# Patient Record
Sex: Male | Born: 1985 | Race: White | Hispanic: No | Marital: Single | Smoking: Former smoker
Health system: Southern US, Community
[De-identification: ages and names within clinical notes are randomized; demographics above are authoritative.]

## PROBLEM LIST (undated history)

## (undated) DIAGNOSIS — S329XXA Fracture of unspecified parts of lumbosacral spine and pelvis, initial encounter for closed fracture: Secondary | ICD-10-CM

## (undated) DIAGNOSIS — S42309A Unspecified fracture of shaft of humerus, unspecified arm, initial encounter for closed fracture: Secondary | ICD-10-CM

## (undated) HISTORY — PX: HAND SURGERY: SHX662

---

## 2006-09-20 ENCOUNTER — Emergency Department (HOSPITAL_COMMUNITY): Admission: EM | Admit: 2006-09-20 | Discharge: 2006-09-20 | Payer: Self-pay | Admitting: Emergency Medicine

## 2007-08-20 ENCOUNTER — Encounter: Admission: RE | Admit: 2007-08-20 | Discharge: 2007-08-20 | Payer: Self-pay | Admitting: Family Medicine

## 2007-09-01 ENCOUNTER — Ambulatory Visit: Payer: Self-pay | Admitting: Psychiatry

## 2007-09-01 ENCOUNTER — Other Ambulatory Visit (HOSPITAL_COMMUNITY): Admission: RE | Admit: 2007-09-01 | Discharge: 2007-09-12 | Payer: Self-pay | Admitting: Psychiatry

## 2009-05-20 ENCOUNTER — Ambulatory Visit (HOSPITAL_BASED_OUTPATIENT_CLINIC_OR_DEPARTMENT_OTHER): Admission: RE | Admit: 2009-05-20 | Discharge: 2009-05-20 | Payer: Self-pay | Admitting: Orthopedic Surgery

## 2010-06-16 LAB — POCT HEMOGLOBIN-HEMACUE: Hemoglobin: 15.7 g/dL (ref 13.0–17.0)

## 2015-03-01 DIAGNOSIS — S42309A Unspecified fracture of shaft of humerus, unspecified arm, initial encounter for closed fracture: Secondary | ICD-10-CM

## 2015-03-01 DIAGNOSIS — S329XXA Fracture of unspecified parts of lumbosacral spine and pelvis, initial encounter for closed fracture: Secondary | ICD-10-CM

## 2015-03-01 HISTORY — DX: Fracture of unspecified parts of lumbosacral spine and pelvis, initial encounter for closed fracture: S32.9XXA

## 2015-03-01 HISTORY — DX: Unspecified fracture of shaft of humerus, unspecified arm, initial encounter for closed fracture: S42.309A

## 2015-03-02 ENCOUNTER — Inpatient Hospital Stay (HOSPITAL_COMMUNITY): Payer: No Typology Code available for payment source | Admitting: Certified Registered"

## 2015-03-02 ENCOUNTER — Emergency Department (HOSPITAL_COMMUNITY): Payer: No Typology Code available for payment source

## 2015-03-02 ENCOUNTER — Encounter (HOSPITAL_COMMUNITY): Payer: Self-pay | Admitting: Emergency Medicine

## 2015-03-02 ENCOUNTER — Encounter (HOSPITAL_COMMUNITY): Admission: EM | Disposition: A | Discharge status: Home Health | Payer: Self-pay | Source: Home / Self Care

## 2015-03-02 ENCOUNTER — Inpatient Hospital Stay (HOSPITAL_COMMUNITY): Payer: No Typology Code available for payment source

## 2015-03-02 ENCOUNTER — Inpatient Hospital Stay (HOSPITAL_COMMUNITY)
Admission: EM | Admit: 2015-03-02 | Discharge: 2015-03-10 | DRG: 958 | Disposition: A | Discharge status: Home Health | Payer: No Typology Code available for payment source | Attending: Surgery | Admitting: Surgery

## 2015-03-02 DIAGNOSIS — S32810A Multiple fractures of pelvis with stable disruption of pelvic ring, initial encounter for closed fracture: Secondary | ICD-10-CM | POA: Diagnosis present

## 2015-03-02 DIAGNOSIS — E875 Hyperkalemia: Secondary | ICD-10-CM | POA: Diagnosis present

## 2015-03-02 DIAGNOSIS — N179 Acute kidney failure, unspecified: Secondary | ICD-10-CM | POA: Diagnosis present

## 2015-03-02 DIAGNOSIS — S32059A Unspecified fracture of fifth lumbar vertebra, initial encounter for closed fracture: Secondary | ICD-10-CM | POA: Diagnosis present

## 2015-03-02 DIAGNOSIS — Y906 Blood alcohol level of 120-199 mg/100 ml: Secondary | ICD-10-CM | POA: Diagnosis present

## 2015-03-02 DIAGNOSIS — S42302B Unspecified fracture of shaft of humerus, left arm, initial encounter for open fracture: Secondary | ICD-10-CM | POA: Diagnosis present

## 2015-03-02 DIAGNOSIS — Z419 Encounter for procedure for purposes other than remedying health state, unspecified: Secondary | ICD-10-CM

## 2015-03-02 DIAGNOSIS — R338 Other retention of urine: Secondary | ICD-10-CM | POA: Diagnosis not present

## 2015-03-02 DIAGNOSIS — R739 Hyperglycemia, unspecified: Secondary | ICD-10-CM | POA: Diagnosis present

## 2015-03-02 DIAGNOSIS — R339 Retention of urine, unspecified: Secondary | ICD-10-CM | POA: Diagnosis not present

## 2015-03-02 DIAGNOSIS — F10129 Alcohol abuse with intoxication, unspecified: Secondary | ICD-10-CM | POA: Diagnosis present

## 2015-03-02 DIAGNOSIS — T148 Other injury of unspecified body region: Secondary | ICD-10-CM | POA: Diagnosis present

## 2015-03-02 DIAGNOSIS — S42302A Unspecified fracture of shaft of humerus, left arm, initial encounter for closed fracture: Secondary | ICD-10-CM | POA: Diagnosis present

## 2015-03-02 DIAGNOSIS — S32009A Unspecified fracture of unspecified lumbar vertebra, initial encounter for closed fracture: Secondary | ICD-10-CM | POA: Diagnosis present

## 2015-03-02 DIAGNOSIS — T148XXA Other injury of unspecified body region, initial encounter: Secondary | ICD-10-CM

## 2015-03-02 DIAGNOSIS — S329XXA Fracture of unspecified parts of lumbosacral spine and pelvis, initial encounter for closed fracture: Secondary | ICD-10-CM

## 2015-03-02 DIAGNOSIS — S32302A Unspecified fracture of left ilium, initial encounter for closed fracture: Secondary | ICD-10-CM | POA: Diagnosis present

## 2015-03-02 HISTORY — PX: I&D EXTREMITY: SHX5045

## 2015-03-02 HISTORY — DX: Fracture of unspecified parts of lumbosacral spine and pelvis, initial encounter for closed fracture: S32.9XXA

## 2015-03-02 HISTORY — PX: ORIF HUMERUS FRACTURE: SHX2126

## 2015-03-02 HISTORY — DX: Unspecified fracture of shaft of humerus, unspecified arm, initial encounter for closed fracture: S42.309A

## 2015-03-02 LAB — CBC
HEMATOCRIT: 45.2 % (ref 39.0–52.0)
HEMOGLOBIN: 15.4 g/dL (ref 13.0–17.0)
MCH: 32.6 pg (ref 26.0–34.0)
MCHC: 34.1 g/dL (ref 30.0–36.0)
MCV: 95.8 fL (ref 78.0–100.0)
Platelets: 317 10*3/uL (ref 150–400)
RBC: 4.72 MIL/uL (ref 4.22–5.81)
RDW: 12.4 % (ref 11.5–15.5)
WBC: 15.4 10*3/uL — AB (ref 4.0–10.5)

## 2015-03-02 LAB — ETHANOL: Alcohol, Ethyl (B): 190 mg/dL — ABNORMAL HIGH (ref <5)

## 2015-03-02 LAB — ABO/RH: ABO/RH(D): O POS

## 2015-03-02 LAB — COMPREHENSIVE METABOLIC PANEL
ALBUMIN: 3.6 g/dL (ref 3.5–5.0)
ALT: 66 U/L — ABNORMAL HIGH (ref 17–63)
ANION GAP: 12 (ref 5–15)
AST: 51 U/L — ABNORMAL HIGH (ref 15–41)
Alkaline Phosphatase: 81 U/L (ref 38–126)
BILIRUBIN TOTAL: 0.5 mg/dL (ref 0.3–1.2)
BUN: 12 mg/dL (ref 6–20)
CHLORIDE: 105 mmol/L (ref 101–111)
CO2: 21 mmol/L — ABNORMAL LOW (ref 22–32)
Calcium: 8.4 mg/dL — ABNORMAL LOW (ref 8.9–10.3)
Creatinine, Ser: 1.48 mg/dL — ABNORMAL HIGH (ref 0.61–1.24)
GLUCOSE: 141 mg/dL — AB (ref 65–99)
POTASSIUM: 3.6 mmol/L (ref 3.5–5.1)
Sodium: 138 mmol/L (ref 135–145)
TOTAL PROTEIN: 6.6 g/dL (ref 6.5–8.1)

## 2015-03-02 LAB — TYPE AND SCREEN
ABO/RH(D): O POS
ANTIBODY SCREEN: NEGATIVE
UNIT DIVISION: 0
Unit division: 0

## 2015-03-02 LAB — PREPARE FRESH FROZEN PLASMA
UNIT DIVISION: 0
Unit division: 0

## 2015-03-02 LAB — BLOOD PRODUCT ORDER (VERBAL) VERIFICATION

## 2015-03-02 LAB — PROTIME-INR
INR: 1.02 (ref 0.00–1.49)
PROTHROMBIN TIME: 13.6 s (ref 11.6–15.2)

## 2015-03-02 LAB — SURGICAL PCR SCREEN
MRSA, PCR: NEGATIVE
Staphylococcus aureus: POSITIVE — AB

## 2015-03-02 LAB — CDS SEROLOGY

## 2015-03-02 SURGERY — IRRIGATION AND DEBRIDEMENT EXTREMITY
Anesthesia: Regional | Site: Arm Upper | Laterality: Left

## 2015-03-02 MED ORDER — METOCLOPRAMIDE HCL 5 MG/ML IJ SOLN
INTRAMUSCULAR | Status: AC
  Filled 2015-03-02: qty 2

## 2015-03-02 MED ORDER — TETANUS-DIPHTH-ACELL PERTUSSIS 5-2.5-18.5 LF-MCG/0.5 IM SUSP
INTRAMUSCULAR | Status: AC
  Filled 2015-03-02: qty 0.5

## 2015-03-02 MED ORDER — MEPERIDINE HCL 25 MG/ML IJ SOLN: 6.2500 mg | INTRAMUSCULAR | Status: DC | PRN

## 2015-03-02 MED ORDER — FENTANYL CITRATE (PF) 250 MCG/5ML IJ SOLN
INTRAMUSCULAR | Status: AC
  Filled 2015-03-02: qty 5

## 2015-03-02 MED ORDER — PROPOFOL 10 MG/ML IV BOLUS
INTRAVENOUS | Status: AC
  Filled 2015-03-02: qty 20

## 2015-03-02 MED ORDER — ROCURONIUM BROMIDE 50 MG/5ML IV SOLN
INTRAVENOUS | Status: AC
  Filled 2015-03-02: qty 1

## 2015-03-02 MED ORDER — ENOXAPARIN SODIUM 40 MG/0.4ML ~~LOC~~ SOLN
40.0000 mg | SUBCUTANEOUS | Status: DC
Start: 2015-03-03 — End: 2015-03-07
  Administered 2015-03-03 – 2015-03-06 (×4): 40 mg via SUBCUTANEOUS
  Filled 2015-03-02 (×4): qty 0.4

## 2015-03-02 MED ORDER — HYDROMORPHONE HCL 1 MG/ML IJ SOLN: 0.2500 mg | INTRAMUSCULAR | Status: DC | PRN

## 2015-03-02 MED ORDER — ONDANSETRON HCL 4 MG/2ML IJ SOLN
INTRAMUSCULAR | Status: DC | PRN
  Administered 2015-03-02: 4 mg via INTRAVENOUS

## 2015-03-02 MED ORDER — MORPHINE SULFATE (PF) 4 MG/ML IV SOLN
4.0000 mg | Freq: Once | INTRAVENOUS | Status: DC
Start: 2015-03-02 — End: 2015-03-02

## 2015-03-02 MED ORDER — SODIUM CHLORIDE 0.9 % IV SOLN
INTRAVENOUS | Status: AC | PRN
  Administered 2015-03-02: 10 mL/h via INTRAVENOUS

## 2015-03-02 MED ORDER — MORPHINE SULFATE (PF) 4 MG/ML IV SOLN: 4.0000 mg | INTRAVENOUS | Status: DC | PRN

## 2015-03-02 MED ORDER — MUPIROCIN 2 % EX OINT
TOPICAL_OINTMENT | Freq: Two times a day (BID) | CUTANEOUS | Status: DC
  Administered 2015-03-02 – 2015-03-03 (×3): via NASAL
  Administered 2015-03-04: 1 via NASAL
  Administered 2015-03-04: 11:00:00 via NASAL
  Administered 2015-03-05: 1 via NASAL
  Administered 2015-03-05: 10:00:00 via NASAL
  Administered 2015-03-06: 1 via NASAL
  Administered 2015-03-06 – 2015-03-10 (×8): via NASAL
  Filled 2015-03-02 (×3): qty 22

## 2015-03-02 MED ORDER — HYDROMORPHONE HCL 1 MG/ML IJ SOLN
1.0000 mg | INTRAMUSCULAR | Status: DC | PRN
  Administered 2015-03-02: 1 mg via INTRAVENOUS
  Filled 2015-03-02: qty 1

## 2015-03-02 MED ORDER — SUCCINYLCHOLINE CHLORIDE 20 MG/ML IJ SOLN
INTRAMUSCULAR | Status: DC | PRN
  Administered 2015-03-02: 120 mg via INTRAVENOUS

## 2015-03-02 MED ORDER — ONDANSETRON HCL 4 MG/2ML IJ SOLN
INTRAMUSCULAR | Status: AC
  Filled 2015-03-02: qty 2

## 2015-03-02 MED ORDER — LACTATED RINGERS IV SOLN
INTRAVENOUS | Status: DC
  Administered 2015-03-02 (×3): via INTRAVENOUS

## 2015-03-02 MED ORDER — METOCLOPRAMIDE HCL 5 MG/ML IJ SOLN
INTRAMUSCULAR | Status: DC | PRN
  Administered 2015-03-02: 10 mg via INTRAVENOUS

## 2015-03-02 MED ORDER — OXYCODONE HCL 5 MG PO TABS: 5.0000 mg | ORAL_TABLET | Freq: Once | ORAL | Status: DC | PRN

## 2015-03-02 MED ORDER — ONDANSETRON HCL 4 MG PO TABS: 4.0000 mg | ORAL_TABLET | Freq: Four times a day (QID) | ORAL | Status: DC | PRN

## 2015-03-02 MED ORDER — MORPHINE SULFATE (PF) 2 MG/ML IV SOLN
2.0000 mg | INTRAVENOUS | Status: DC | PRN
  Administered 2015-03-02 (×2): 2 mg via INTRAVENOUS
  Filled 2015-03-02 (×3): qty 1

## 2015-03-02 MED ORDER — PROMETHAZINE HCL 25 MG/ML IJ SOLN
6.2500 mg | INTRAMUSCULAR | Status: DC | PRN
Start: 2015-03-02 — End: 2015-03-02

## 2015-03-02 MED ORDER — MORPHINE SULFATE (PF) 2 MG/ML IV SOLN
INTRAVENOUS | Status: AC
  Administered 2015-03-02: 4 mg via INTRAVENOUS
  Filled 2015-03-02: qty 2

## 2015-03-02 MED ORDER — CEFAZOLIN SODIUM 1-5 GM-% IV SOLN
1.0000 g | Freq: Three times a day (TID) | INTRAVENOUS | Status: AC
  Administered 2015-03-03 (×3): 1 g via INTRAVENOUS
  Filled 2015-03-02 (×3): qty 50

## 2015-03-02 MED ORDER — POTASSIUM CHLORIDE IN NACL 20-0.9 MEQ/L-% IV SOLN
INTRAVENOUS | Status: DC
  Administered 2015-03-02: 12:00:00 via INTRAVENOUS
  Filled 2015-03-02: qty 1000

## 2015-03-02 MED ORDER — FENTANYL CITRATE (PF) 100 MCG/2ML IJ SOLN
INTRAMUSCULAR | Status: DC | PRN
  Administered 2015-03-02: 50 ug via INTRAVENOUS
  Administered 2015-03-02: 100 ug via INTRAVENOUS
  Administered 2015-03-02: 150 ug via INTRAVENOUS
  Administered 2015-03-02 (×2): 100 ug via INTRAVENOUS

## 2015-03-02 MED ORDER — LIDOCAINE HCL (CARDIAC) 20 MG/ML IV SOLN
INTRAVENOUS | Status: DC | PRN
  Administered 2015-03-02: 20 mg via INTRAVENOUS

## 2015-03-02 MED ORDER — PROMETHAZINE HCL 25 MG/ML IJ SOLN: 6.2500 mg | INTRAMUSCULAR | Status: DC | PRN

## 2015-03-02 MED ORDER — FENTANYL CITRATE (PF) 100 MCG/2ML IJ SOLN
INTRAMUSCULAR | Status: AC
  Administered 2015-03-02: 100 ug
  Filled 2015-03-02: qty 2

## 2015-03-02 MED ORDER — TETANUS-DIPHTH-ACELL PERTUSSIS 5-2.5-18.5 LF-MCG/0.5 IM SUSP
0.5000 mL | Freq: Once | INTRAMUSCULAR | Status: AC
  Administered 2015-03-02: 0.5 mL via INTRAMUSCULAR

## 2015-03-02 MED ORDER — BUPIVACAINE-EPINEPHRINE (PF) 0.5% -1:200000 IJ SOLN
INTRAMUSCULAR | Status: DC | PRN
  Administered 2015-03-02: 30 mL via PERINEURAL

## 2015-03-02 MED ORDER — HYDROMORPHONE HCL 1 MG/ML IJ SOLN
INTRAMUSCULAR | Status: AC
  Filled 2015-03-02: qty 1

## 2015-03-02 MED ORDER — DEXAMETHASONE SODIUM PHOSPHATE 10 MG/ML IJ SOLN
INTRAMUSCULAR | Status: AC
  Filled 2015-03-02: qty 1

## 2015-03-02 MED ORDER — DIPHENHYDRAMINE HCL 50 MG/ML IJ SOLN
INTRAMUSCULAR | Status: DC | PRN
  Administered 2015-03-02: 25 mg via INTRAVENOUS

## 2015-03-02 MED ORDER — MIDAZOLAM HCL 2 MG/2ML IJ SOLN
INTRAMUSCULAR | Status: AC
  Filled 2015-03-02: qty 2

## 2015-03-02 MED ORDER — MORPHINE SULFATE (PF) 2 MG/ML IV SOLN: 1.0000 mg | INTRAVENOUS | Status: DC | PRN

## 2015-03-02 MED ORDER — MIDAZOLAM HCL 5 MG/5ML IJ SOLN
INTRAMUSCULAR | Status: DC | PRN
  Administered 2015-03-02: 2 mg via INTRAVENOUS

## 2015-03-02 MED ORDER — CEFAZOLIN SODIUM-DEXTROSE 2-3 GM-% IV SOLR
INTRAVENOUS | Status: DC | PRN
  Administered 2015-03-02: 2 g via INTRAVENOUS

## 2015-03-02 MED ORDER — CEFAZOLIN SODIUM-DEXTROSE 2-3 GM-% IV SOLR
INTRAVENOUS | Status: AC
  Administered 2015-03-02: 2000 mg via INTRAVENOUS
  Filled 2015-03-02: qty 50

## 2015-03-02 MED ORDER — OXYCODONE HCL 5 MG/5ML PO SOLN: 5.0000 mg | Freq: Once | ORAL | Status: DC | PRN

## 2015-03-02 MED ORDER — DIPHENHYDRAMINE HCL 50 MG/ML IJ SOLN
INTRAMUSCULAR | Status: AC
  Filled 2015-03-02: qty 1

## 2015-03-02 MED ORDER — BETHANECHOL CHLORIDE 25 MG PO TABS
25.0000 mg | ORAL_TABLET | Freq: Four times a day (QID) | ORAL | Status: DC
  Administered 2015-03-02 – 2015-03-10 (×30): 25 mg via ORAL
  Filled 2015-03-02 (×30): qty 1

## 2015-03-02 MED ORDER — CEFAZOLIN SODIUM-DEXTROSE 2-3 GM-% IV SOLR
2.0000 g | Freq: Once | INTRAVENOUS | Status: AC
  Administered 2015-03-02: 2000 mg via INTRAVENOUS

## 2015-03-02 MED ORDER — PHENYLEPHRINE 40 MCG/ML (10ML) SYRINGE FOR IV PUSH (FOR BLOOD PRESSURE SUPPORT)
PREFILLED_SYRINGE | INTRAVENOUS | Status: AC
  Filled 2015-03-02: qty 10

## 2015-03-02 MED ORDER — MUPIROCIN 2 % EX OINT: TOPICAL_OINTMENT | Freq: Two times a day (BID) | CUTANEOUS | Status: DC

## 2015-03-02 MED ORDER — PNEUMOCOCCAL VAC POLYVALENT 25 MCG/0.5ML IJ INJ
0.5000 mL | INJECTION | INTRAMUSCULAR | Status: AC
  Administered 2015-03-03: 0.5 mL via INTRAMUSCULAR
  Filled 2015-03-02: qty 0.5

## 2015-03-02 MED ORDER — CEFAZOLIN SODIUM-DEXTROSE 2-3 GM-% IV SOLR
2.0000 g | INTRAVENOUS | Status: DC
  Filled 2015-03-02 (×2): qty 50

## 2015-03-02 MED ORDER — MUPIROCIN 2 % EX OINT
TOPICAL_OINTMENT | CUTANEOUS | Status: AC
  Filled 2015-03-02: qty 22

## 2015-03-02 MED ORDER — PROPOFOL 10 MG/ML IV BOLUS
INTRAVENOUS | Status: DC | PRN
  Administered 2015-03-02: 200 mg via INTRAVENOUS

## 2015-03-02 MED ORDER — ONDANSETRON HCL 4 MG/2ML IJ SOLN: 4.0000 mg | Freq: Four times a day (QID) | INTRAMUSCULAR | Status: DC | PRN

## 2015-03-02 MED ORDER — DEXAMETHASONE SODIUM PHOSPHATE 10 MG/ML IJ SOLN
INTRAMUSCULAR | Status: DC | PRN
  Administered 2015-03-02: 10 mg via INTRAVENOUS

## 2015-03-02 MED ORDER — MORPHINE SULFATE (PF) 4 MG/ML IV SOLN
4.0000 mg | Freq: Once | INTRAVENOUS | Status: AC
  Administered 2015-03-02: 4 mg via INTRAVENOUS
  Filled 2015-03-02: qty 1

## 2015-03-02 MED ORDER — SODIUM CHLORIDE 0.9 % IR SOLN
Status: DC | PRN
  Administered 2015-03-02: 1000 mL

## 2015-03-02 MED ORDER — IOHEXOL 300 MG/ML  SOLN
100.0000 mL | Freq: Once | INTRAMUSCULAR | Status: AC | PRN
  Administered 2015-03-02: 100 mL via INTRAVENOUS

## 2015-03-02 SURGICAL SUPPLY — 64 items
BAG DECANTER FOR FLEXI CONT (MISCELLANEOUS) IMPLANT
BANDAGE ELASTIC 4 VELCRO ST LF (GAUZE/BANDAGES/DRESSINGS) ×3 IMPLANT
BENZOIN TINCTURE PRP APPL 2/3 (GAUZE/BANDAGES/DRESSINGS) ×3 IMPLANT
BIT DRILL QC 3.3X195 (BIT) ×3 IMPLANT
BNDG COHESIVE 4X5 TAN STRL (GAUZE/BANDAGES/DRESSINGS) ×3 IMPLANT
BNDG GAUZE ELAST 4 BULKY (GAUZE/BANDAGES/DRESSINGS) ×3 IMPLANT
CLOSURE WOUND 1/2 X4 (GAUZE/BANDAGES/DRESSINGS) ×1
COVER SURGICAL LIGHT HANDLE (MISCELLANEOUS) ×3 IMPLANT
CUFF TOURNIQUET SINGLE 18IN (TOURNIQUET CUFF) IMPLANT
DRAPE C-ARM 42X72 X-RAY (DRAPES) ×3 IMPLANT
DRAPE IMP U-DRAPE 54X76 (DRAPES) ×3 IMPLANT
DRAPE INCISE IOBAN 66X45 STRL (DRAPES) IMPLANT
DRAPE SURG 17X23 STRL (DRAPES) ×3 IMPLANT
DRAPE U-SHAPE 47X51 STRL (DRAPES) ×3 IMPLANT
DRSG AQUACEL AG ADV 3.5X14 (GAUZE/BANDAGES/DRESSINGS) ×3 IMPLANT
DRSG EMULSION OIL 3X3 NADH (GAUZE/BANDAGES/DRESSINGS) ×3 IMPLANT
DRSG PAD ABDOMINAL 8X10 ST (GAUZE/BANDAGES/DRESSINGS) ×6 IMPLANT
ELECT REM PT RETURN 9FT ADLT (ELECTROSURGICAL) ×3
ELECTRODE REM PT RTRN 9FT ADLT (ELECTROSURGICAL) ×1 IMPLANT
GAUZE SPONGE 4X4 12PLY STRL (GAUZE/BANDAGES/DRESSINGS) ×3 IMPLANT
GAUZE XEROFORM 5X9 LF (GAUZE/BANDAGES/DRESSINGS) ×3 IMPLANT
GLOVE BIOGEL PI IND STRL 8 (GLOVE) ×1 IMPLANT
GLOVE BIOGEL PI INDICATOR 8 (GLOVE) ×2
GLOVE ORTHO TXT STRL SZ7.5 (GLOVE) ×3 IMPLANT
GOWN STRL REUS W/ TWL LRG LVL3 (GOWN DISPOSABLE) ×2 IMPLANT
GOWN STRL REUS W/ TWL XL LVL3 (GOWN DISPOSABLE) IMPLANT
GOWN STRL REUS W/TWL 2XL LVL3 (GOWN DISPOSABLE) ×3 IMPLANT
GOWN STRL REUS W/TWL LRG LVL3 (GOWN DISPOSABLE) ×4
GOWN STRL REUS W/TWL XL LVL3 (GOWN DISPOSABLE)
HANDPIECE INTERPULSE COAX TIP (DISPOSABLE) ×2
KIT BASIN OR (CUSTOM PROCEDURE TRAY) ×3 IMPLANT
KIT ROOM TURNOVER OR (KITS) ×3 IMPLANT
MANIFOLD NEPTUNE II (INSTRUMENTS) ×3 IMPLANT
NEEDLE HYPO 25GX1X1/2 BEV (NEEDLE) IMPLANT
NS IRRIG 1000ML POUR BTL (IV SOLUTION) ×3 IMPLANT
PACK ORTHO EXTREMITY (CUSTOM PROCEDURE TRAY) IMPLANT
PACK SHOULDER (CUSTOM PROCEDURE TRAY) ×3 IMPLANT
PACK UNIVERSAL I (CUSTOM PROCEDURE TRAY) IMPLANT
PAD ARMBOARD 7.5X6 YLW CONV (MISCELLANEOUS) ×6 IMPLANT
PLATE NCB STRT PA 146 10H (Plate) ×3 IMPLANT
SCREW NCB 4.0 22MM (Screw) ×3 IMPLANT
SCREW NCB 4.0 28MM (Screw) ×3 IMPLANT
SCREW NCB 4.0X24MM (Screw) ×12 IMPLANT
SCREW NCB 4.0X26MM (Screw) ×12 IMPLANT
SET HNDPC FAN SPRY TIP SCT (DISPOSABLE) ×1 IMPLANT
SLING ARM FOAM STRAP LRG (SOFTGOODS) ×3 IMPLANT
SPONGE LAP 18X18 X RAY DECT (DISPOSABLE) ×3 IMPLANT
SPONGE LAP 4X18 X RAY DECT (DISPOSABLE) IMPLANT
STAPLER VISISTAT 35W (STAPLE) ×3 IMPLANT
STOCKINETTE IMPERVIOUS 9X36 MD (GAUZE/BANDAGES/DRESSINGS) IMPLANT
STRIP CLOSURE SKIN 1/2X4 (GAUZE/BANDAGES/DRESSINGS) ×2 IMPLANT
SUCTION FRAZIER TIP 10 FR DISP (SUCTIONS) ×3 IMPLANT
SUT ETHILON 4 0 PS 2 18 (SUTURE) IMPLANT
SUT FIBERWIRE #2 38 T-5 BLUE (SUTURE)
SUT VIC AB 2-0 CT1 27 (SUTURE) ×2
SUT VIC AB 2-0 CT1 TAPERPNT 27 (SUTURE) ×1 IMPLANT
SUT VIC AB 3-0 FS2 27 (SUTURE) ×3 IMPLANT
SUTURE FIBERWR #2 38 T-5 BLUE (SUTURE) IMPLANT
SYR CONTROL 10ML LL (SYRINGE) IMPLANT
TOWEL OR 17X24 6PK STRL BLUE (TOWEL DISPOSABLE) ×3 IMPLANT
TOWEL OR 17X26 10 PK STRL BLUE (TOWEL DISPOSABLE) ×3 IMPLANT
TUBE CONNECTING 12'X1/4 (SUCTIONS) ×1
TUBE CONNECTING 12X1/4 (SUCTIONS) ×2 IMPLANT
YANKAUER SUCT BULB TIP NO VENT (SUCTIONS) ×3 IMPLANT

## 2015-03-02 NOTE — Anesthesia Procedure Notes (Addendum)
Anesthesia Regional Block:  Supraclavicular block  Pre-Anesthetic Checklist: ,, timeout performed, Correct Patient, Correct Site, Correct Laterality, Correct Procedure, Correct Position, site marked, Risks and benefits discussed,  Surgical consent,  Pre-op evaluation,  At surgeon's request and post-op pain management  Laterality: Left  Prep: chloraprep       Needles:  Injection technique: Single-shot  Needle Type: Echogenic Needle     Needle Length: 9cm 9 cm Needle Gauge: 21 and 21 G    Additional Needles:  Procedures: ultrasound guided (picture in chart) Supraclavicular block Narrative:  Start time: 03/02/2015 8:20 PM End time: 03/02/2015 8:30 PM Injection made incrementally with aspirations every 5 mL.  Performed by: Personally  Anesthesiologist: Marcene DuosFITZGERALD, Avalyn Molino  Additional Notes: Risks, benefits and alternative to block explained extensively.  Patient tolerated procedure well, without complications.   Procedure Name: Intubation Date/Time: 03/02/2015 8:46 PM Performed by: Arlice ColtMANESS, CARRIE B Pre-anesthesia Checklist: Patient identified, Emergency Drugs available, Suction available, Patient being monitored and Timeout performed Patient Re-evaluated:Patient Re-evaluated prior to inductionOxygen Delivery Method: Circle system utilized Preoxygenation: Pre-oxygenation with 100% oxygen Intubation Type: IV induction and Rapid sequence Laryngoscope Size: Mac and 4 Grade View: Grade I Tube type: Oral Tube size: 7.5 mm Number of attempts: 1 Airway Equipment and Method: Stylet Placement Confirmation: ETT inserted through vocal cords under direct vision,  positive ETCO2 and breath sounds checked- equal and bilateral Secured at: 22 cm Tube secured with: Tape Dental Injury: Teeth and Oropharynx as per pre-operative assessment
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
## 2015-03-02 NOTE — ED Notes (Signed)
Patient transported to X-ray 
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
## 2015-03-02 NOTE — Transfer of Care (Signed)
Immediate Anesthesia Transfer of Care Note  Patient: [name]  Procedure(s) Performed: Procedure(s): IRRIGATION AND DEBRIDEMENT LEFT OPEN HUMERUS FRACTURE (Left) PLATING  HUMERUS FRACTURE (Left)  Patient Location: PACU  Anesthesia Type:General and Regional  Level of Consciousness: responds to stimulation  Airway & Oxygen Therapy: Patient Spontanous Breathing and Patient connected to nasal cannula oxygen  Post-op Assessment: Report given to RN and Post -op Vital signs reviewed and stable  Post vital signs: Reviewed and stable  Last Vitals:  Filed Vitals:   03/02/15 1124 03/02/15 1919  BP: 136/81 157/81  Pulse: 80 80  Temp: 36.4 C 36.7 C  Resp: 18 18    Complications: No apparent anesthesia complications
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
## 2015-03-02 NOTE — Anesthesia Preprocedure Evaluation (Addendum)
Anesthesia Evaluation  Patient identified by MRN, date of birth, ID band Patient awake    Reviewed: Allergy & Precautions, NPO status , Patient's Chart, lab work & pertinent test results  History of Anesthesia Complications (+) PONV  Airway Mallampati: II  TM Distance: >3 FB Neck ROM: Full    Dental no notable dental hx.    Pulmonary neg pulmonary ROS, former smoker,    Pulmonary exam normal breath sounds clear to auscultation       Cardiovascular negative cardio ROS Normal cardiovascular exam Rhythm:Regular Rate:Normal     Neuro/Psych negative neurological ROS  negative psych ROS   GI/Hepatic negative GI ROS, (+)     substance abuse  marijuana use,   Endo/Other  negative endocrine ROS  Renal/GU negative Renal ROS     Musculoskeletal negative musculoskeletal ROS (+)   Abdominal   Peds  Hematology negative hematology ROS (+)   Anesthesia Other Findings   Reproductive/Obstetrics                          Anesthesia Physical Anesthesia Plan  ASA: II  Anesthesia Plan: General and Regional   Post-op Pain Management: MAC Combined w/ Regional for Post-op pain   Induction: Intravenous  Airway Management Planned: Oral ETT  Additional Equipment:   Intra-op Plan:   Post-operative Plan: Extubation in OR  Informed Consent: I have reviewed the patients History and Physical, chart, labs and discussed the procedure including the risks, benefits and alternatives for the proposed anesthesia with the patient or authorized representative who has indicated his/her understanding and acceptance.   Dental advisory given  Plan Discussed with: CRNA, Anesthesiologist and Surgeon  Anesthesia Plan Comments:        Anesthesia Quick Evaluation

## 2015-03-03 ENCOUNTER — Encounter (HOSPITAL_COMMUNITY): Payer: Self-pay | Admitting: Orthopaedic Surgery

## 2015-03-03 LAB — BASIC METABOLIC PANEL
ANION GAP: 6 (ref 5–15)
ANION GAP: 8 (ref 5–15)
BUN: 16 mg/dL (ref 6–20)
BUN: 15 mg/dL (ref 6–20)
CALCIUM: 8.3 mg/dL — AB (ref 8.9–10.3)
CHLORIDE: 103 mmol/L (ref 101–111)
CO2: 22 mmol/L (ref 22–32)
CO2: 22 mmol/L (ref 22–32)
CREATININE: 1.62 mg/dL — AB (ref 0.61–1.24)
Calcium: 7.3 mg/dL — ABNORMAL LOW (ref 8.9–10.3)
Chloride: 105 mmol/L (ref 101–111)
Creatinine, Ser: 1.35 mg/dL — ABNORMAL HIGH (ref 0.61–1.24)
GFR calc non Af Amer: 55 mL/min — ABNORMAL LOW (ref >60)
Glucose, Bld: 554 mg/dL (ref 65–99)
Glucose, Bld: 146 mg/dL — ABNORMAL HIGH (ref 65–99)
POTASSIUM: 6.3 mmol/L — AB (ref 3.5–5.1)
POTASSIUM: 4.2 mmol/L (ref 3.5–5.1)
Sodium: 131 mmol/L — ABNORMAL LOW (ref 135–145)
Sodium: 135 mmol/L (ref 135–145)

## 2015-03-03 LAB — CBC
HEMATOCRIT: 36.8 % — AB (ref 39.0–52.0)
HEMOGLOBIN: 12.4 g/dL — AB (ref 13.0–17.0)
MCH: 31.9 pg (ref 26.0–34.0)
MCHC: 33.2 g/dL (ref 30.0–36.0)
MCV: 96.1 fL (ref 78.0–100.0)
Platelets: 258 10*3/uL (ref 150–400)
RBC: 3.83 MIL/uL — ABNORMAL LOW (ref 4.22–5.81)
RDW: 12.7 % (ref 11.5–15.5)
WBC: 8.1 10*3/uL (ref 4.0–10.5)

## 2015-03-03 MED ORDER — HYDROMORPHONE HCL 1 MG/ML IJ SOLN
0.5000 mg | INTRAMUSCULAR | Status: DC | PRN
  Administered 2015-03-03: 2 mg via INTRAVENOUS
  Administered 2015-03-03: 1 mg via INTRAVENOUS
  Administered 2015-03-03: 2 mg via INTRAVENOUS
  Administered 2015-03-03: 1 mg via INTRAVENOUS
  Administered 2015-03-03 – 2015-03-04 (×2): 2 mg via INTRAVENOUS
  Administered 2015-03-04: 1 mg via INTRAVENOUS
  Administered 2015-03-05 (×2): 2 mg via INTRAVENOUS
  Administered 2015-03-05: 1 mg via INTRAVENOUS
  Administered 2015-03-05 – 2015-03-07 (×10): 2 mg via INTRAVENOUS
  Filled 2015-03-03: qty 2
  Filled 2015-03-03: qty 1
  Filled 2015-03-03 (×7): qty 2
  Filled 2015-03-03: qty 1
  Filled 2015-03-03 (×5): qty 2
  Filled 2015-03-03: qty 1
  Filled 2015-03-03 (×2): qty 2
  Filled 2015-03-03: qty 1
  Filled 2015-03-03: qty 2

## 2015-03-03 MED ORDER — DOCUSATE SODIUM 100 MG PO CAPS
100.0000 mg | ORAL_CAPSULE | Freq: Two times a day (BID) | ORAL | Status: DC
  Administered 2015-03-03 – 2015-03-07 (×9): 100 mg via ORAL
  Filled 2015-03-03 (×9): qty 1

## 2015-03-03 MED ORDER — OXYCODONE-ACETAMINOPHEN 5-325 MG PO TABS
1.0000 | ORAL_TABLET | ORAL | Status: DC | PRN
  Administered 2015-03-03 – 2015-03-07 (×12): 2 via ORAL
  Filled 2015-03-03 (×13): qty 2

## 2015-03-03 MED ORDER — KCL IN DEXTROSE-NACL 20-5-0.45 MEQ/L-%-% IV SOLN
INTRAVENOUS | Status: DC
  Administered 2015-03-03: 02:00:00 via INTRAVENOUS
  Filled 2015-03-03: qty 1000

## 2015-03-03 MED ORDER — POLYETHYLENE GLYCOL 3350 17 G PO PACK
17.0000 g | PACK | Freq: Every day | ORAL | Status: DC | PRN
  Administered 2015-03-03 – 2015-03-09 (×2): 17 g via ORAL
  Filled 2015-03-03 (×2): qty 1

## 2015-03-03 MED ORDER — SODIUM CHLORIDE 0.9 % IV SOLN
INTRAVENOUS | Status: DC
  Administered 2015-03-03 – 2015-03-05 (×5): via INTRAVENOUS
  Administered 2015-03-05 – 2015-03-06 (×4): 1 mL via INTRAVENOUS
  Administered 2015-03-07 – 2015-03-08 (×2): via INTRAVENOUS
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
## 2015-03-03 NOTE — Anesthesia Postprocedure Evaluation (Signed)
Anesthesia Post Note  Patient: Cyril LoosenJonathan T Lowdermilk  Procedure(s) Performed: Procedure(s) (LRB): IRRIGATION AND DEBRIDEMENT LEFT OPEN HUMERUS FRACTURE (Left) PLATING  HUMERUS FRACTURE (Left)  Patient location during evaluation: PACU Anesthesia Type: General and Regional Level of consciousness: awake and alert Pain management: satisfactory to patient Vital Signs Assessment: post-procedure vital signs reviewed and stable Respiratory status: spontaneous breathing Cardiovascular status: blood pressure returned to baseline Anesthetic complications: no    Last Vitals:  Filed Vitals:   03/02/15 2250 03/02/15 2327  BP: 125/78 162/94  Pulse: 90 92  Temp:    Resp: 15 18    Last Pain:  Filed Vitals:   03/02/15 2341  PainSc: 0-No pain                 Kennieth RadFitzgerald, Chinedu Agustin E

## 2015-03-03 NOTE — Progress Notes (Signed)
Patient unable to void today after foley removed this morning. MD notified and order given to place foley. Foley placed with no difficulty. out.

## 2015-03-03 NOTE — Progress Notes (Signed)
Subjective: 1 Day Post-Op Procedure(s) (LRB): IRRIGATION AND DEBRIDEMENT LEFT OPEN HUMERUS FRACTURE (Left) PLATING  HUMERUS FRACTURE (Left) Patient reports pain as 0 on 0-10 scale.  No arm pain block still working. No sensation, no motor. Pelvic pain mild unless he moves.   Plan surgery iliac wing in a few days.   Objective: Vital signs in last 24 hours: Temp:  [97.3 F (36.3 C)-98.1 F (36.7 C)] 97.3 F (36.3 C) (12/08 0505) Pulse Rate:  [72-92] 78 (12/08 0505) Resp:  [15-25] 20 (12/08 0505) BP: (113-162)/(62-94) 140/83 mmHg (12/08 0505) SpO2:  [92 %-98 %] 98 % (12/08 0505) Weight:  [100.6 kg (221 lb 12.5 oz)] 100.6 kg (221 lb 12.5 oz) (12/07 1919)  Intake/Output from previous day: 12/07 0701 - 12/08 0700 In: 2703.3 [P.O.:240; I.V.:2463.3] Out: 3325 [Urine:3175; Blood:150] Intake/Output this shift:     Recent Labs  03/02/15 0425 03/03/15 0606  HGB 15.4 12.4*    Recent Labs  03/02/15 0425 03/03/15 0606  WBC 15.4* 8.1  RBC 4.72 3.83*  HCT 45.2 36.8*  PLT 317 258    Recent Labs  03/02/15 0425 03/03/15 0606  NA 138 131*  K 3.6 6.3*  CL 105 103  CO2 21* 22  BUN 12 16  CREATININE 1.48* 1.62*  GLUCOSE 141* 554*  CALCIUM 8.4* 7.3*    Recent Labs  03/02/15 0425  INR 1.02    left UE block still working.   Assessment/Plan: 1 Day Post-Op Procedure(s) (LRB): IRRIGATION AND DEBRIDEMENT LEFT OPEN HUMERUS FRACTURE (Left) PLATING  HUMERUS FRACTURE (Left) Plan:   Iliac surgery Monday. OT for elbow ROM once block wears off. Hgb stable.   YATES,MARK C 03/03/2015, 8:07 AM
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
## 2015-03-04 MED ORDER — METHOCARBAMOL 750 MG PO TABS
750.0000 mg | ORAL_TABLET | Freq: Four times a day (QID) | ORAL | Status: DC | PRN
  Administered 2015-03-04 – 2015-03-08 (×6): 750 mg via ORAL
  Filled 2015-03-04 (×6): qty 1

## 2015-03-04 MED ORDER — TAMSULOSIN HCL 0.4 MG PO CAPS
0.4000 mg | ORAL_CAPSULE | Freq: Every day | ORAL | Status: DC
  Administered 2015-03-04 – 2015-03-10 (×7): 0.4 mg via ORAL
  Filled 2015-03-04 (×7): qty 1

## 2015-03-04 NOTE — Progress Notes (Signed)
Physical medicine rehabilitation consult requested chart reviewed. Patient for iliac wing surgery Monday as noted in chart. Will await surgical intervention and follow-up with appropriate recommendations at that time.
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
## 2015-03-04 NOTE — Progress Notes (Signed)
PT Cancellation Note  Patient Details Name: Chris LoosenJonathan T Leblanc MRN: [id number] DOB: [birth year]   Cancelled Treatment:    Reason Eval/Treat Not Completed: Fatigue/lethargy limiting ability to participate (pt sleeping, he stated he hadn't slept all night and wants to sleep right now, he agreed to PT attempting later today. Will follow. )   Tamala SerUhlenberg, Pierina Schuknecht Kistler 03/04/2015, 11:35 AM [phone]

## 2015-03-04 NOTE — Progress Notes (Signed)
Subjective: Doing well.  Pain controlled.    Objective: Vital signs in last 24 hours: Temp:  [98.4 F (36.9 C)-99.1 F (37.3 C)] 99.1 F (37.3 C) (12/09 0620) Pulse Rate:  [72-79] 74 (12/09 0620) Resp:  [17-20] 18 (12/09 0620) BP: (134-160)/(73-88) 134/73 mmHg (12/09 0620) SpO2:  [95 %-98 %] 95 % (12/09 0620) Weight:  [102.5 kg (225 lb 15.5 oz)] 102.5 kg (225 lb 15.5 oz) (12/09 0620)  Intake/Output from previous day: 12/08 0701 - 12/09 0700 In: 4538 [P.O.:2160; I.V.:2328; IV Piggyback:50] Out: 3200 [Urine:3200] Intake/Output this shift: Total I/O In: -  Out: 450 [Urine:450]   Recent Labs  03/02/15 0425 03/03/15 0606  HGB 15.4 12.4*    Recent Labs  03/02/15 0425 03/03/15 0606  WBC 15.4* 8.1  RBC 4.72 3.83*  HCT 45.2 36.8*  PLT 317 258    Recent Labs  03/03/15 0606 03/03/15 0916  NA 131* 135  K 6.3* 4.2  CL 103 105  CO2 22 22  BUN 16 15  CREATININE 1.62* 1.35*  GLUCOSE 554* 146*  CALCIUM 7.3* 8.3*    Recent Labs  03/02/15 0425  INR 1.02    Exam:  Alert and oriented.  aquacel dressing left upper arm intact. No signs of infe NVI intact.  No motor deficits.    Assessment/Plan: Continue present care.  Plan for OR Monday for iliac wing fracture.     OWENS,JAMES M 03/04/2015, 11:36 AM
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
## 2015-03-04 NOTE — Plan of Care (Signed)
Problem: Bowel/Gastric: Goal: Gastrointestinal status for postoperative course will improve Outcome: Progressing Patient able to pass gas.
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
## 2015-03-05 MED ORDER — DIPHENHYDRAMINE HCL 25 MG PO CAPS
25.0000 mg | ORAL_CAPSULE | Freq: Three times a day (TID) | ORAL | Status: DC | PRN
  Administered 2015-03-05 – 2015-03-06 (×3): 25 mg via ORAL
  Filled 2015-03-05 (×3): qty 1

## 2015-03-05 NOTE — Progress Notes (Signed)
Patient ID: Chris LoosenJonathan T Leblanc, male   DOB: 1985/06/28, 29 y.o.   MRN: [id number] Postoperative day 1 open reduction internal fixation left humerus. Patient's left upper extremity is neurovascularly intact. Anticipate return to the operating room on Monday for open reduction internal fixation of his iliac wing fracture
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
## 2015-03-07 ENCOUNTER — Encounter (HOSPITAL_COMMUNITY): Admission: EM | Disposition: A | Discharge status: Home Health | Payer: Self-pay | Source: Home / Self Care

## 2015-03-07 ENCOUNTER — Inpatient Hospital Stay (HOSPITAL_COMMUNITY): Payer: No Typology Code available for payment source | Admitting: Certified Registered Nurse Anesthetist

## 2015-03-07 ENCOUNTER — Encounter (HOSPITAL_COMMUNITY): Payer: Self-pay | Admitting: Anesthesiology

## 2015-03-07 ENCOUNTER — Inpatient Hospital Stay (HOSPITAL_COMMUNITY): Payer: No Typology Code available for payment source

## 2015-03-07 DIAGNOSIS — R338 Other retention of urine: Secondary | ICD-10-CM | POA: Diagnosis not present

## 2015-03-07 DIAGNOSIS — S32302A Unspecified fracture of left ilium, initial encounter for closed fracture: Secondary | ICD-10-CM | POA: Diagnosis present

## 2015-03-07 DIAGNOSIS — S42302A Unspecified fracture of shaft of humerus, left arm, initial encounter for closed fracture: Secondary | ICD-10-CM | POA: Diagnosis present

## 2015-03-07 HISTORY — PX: ORIF PELVIC FRACTURE: SHX2128

## 2015-03-07 LAB — POCT I-STAT 4, (NA,K, GLUC, HGB,HCT)
Glucose, Bld: 98 mg/dL (ref 65–99)
HCT: 37 % — ABNORMAL LOW (ref 39.0–52.0)
Hemoglobin: 12.6 g/dL — ABNORMAL LOW (ref 13.0–17.0)
Potassium: 3.8 mmol/L (ref 3.5–5.1)
Sodium: 136 mmol/L (ref 135–145)

## 2015-03-07 LAB — CBC WITH DIFFERENTIAL/PLATELET
Basophils Absolute: 0 K/uL (ref 0.0–0.1)
Basophils Relative: 0 %
Eosinophils Absolute: 0.2 K/uL (ref 0.0–0.7)
Eosinophils Relative: 3 %
HCT: 35.4 % — ABNORMAL LOW (ref 39.0–52.0)
Hemoglobin: 12.2 g/dL — ABNORMAL LOW (ref 13.0–17.0)
Lymphocytes Relative: 17 %
Lymphs Abs: 1.6 K/uL (ref 0.7–4.0)
MCH: 32.3 pg (ref 26.0–34.0)
MCHC: 34.5 g/dL (ref 30.0–36.0)
MCV: 93.7 fL (ref 78.0–100.0)
Monocytes Absolute: 0.6 K/uL (ref 0.1–1.0)
Monocytes Relative: 7 %
Neutro Abs: 7 K/uL (ref 1.7–7.7)
Neutrophils Relative %: 73 %
Platelets: 224 K/uL (ref 150–400)
RBC: 3.78 MIL/uL — ABNORMAL LOW (ref 4.22–5.81)
RDW: 12.2 % (ref 11.5–15.5)
WBC: 9.4 K/uL (ref 4.0–10.5)

## 2015-03-07 SURGERY — OPEN REDUCTION INTERNAL FIXATION (ORIF) PELVIC FRACTURE
Anesthesia: General | Laterality: Left

## 2015-03-07 MED ORDER — ACETAMINOPHEN 650 MG RE SUPP: 650.0000 mg | Freq: Four times a day (QID) | RECTAL | Status: DC | PRN

## 2015-03-07 MED ORDER — PROPOFOL 10 MG/ML IV BOLUS
INTRAVENOUS | Status: DC | PRN
  Administered 2015-03-07: 200 mg via INTRAVENOUS

## 2015-03-07 MED ORDER — HYDROMORPHONE HCL 1 MG/ML IJ SOLN
INTRAMUSCULAR | Status: AC
  Filled 2015-03-07: qty 1

## 2015-03-07 MED ORDER — METOCLOPRAMIDE HCL 5 MG PO TABS: 5.0000 mg | ORAL_TABLET | Freq: Three times a day (TID) | ORAL | Status: DC | PRN

## 2015-03-07 MED ORDER — PHENYLEPHRINE 40 MCG/ML (10ML) SYRINGE FOR IV PUSH (FOR BLOOD PRESSURE SUPPORT)
PREFILLED_SYRINGE | INTRAVENOUS | Status: AC
Start: 2015-03-07 — End: 2015-03-07
  Filled 2015-03-07: qty 20

## 2015-03-07 MED ORDER — METOCLOPRAMIDE HCL 5 MG/ML IJ SOLN: 5.0000 mg | Freq: Three times a day (TID) | INTRAMUSCULAR | Status: DC | PRN

## 2015-03-07 MED ORDER — MIDAZOLAM HCL 5 MG/5ML IJ SOLN
INTRAMUSCULAR | Status: DC | PRN
  Administered 2015-03-07: 2 mg via INTRAVENOUS

## 2015-03-07 MED ORDER — HYDROMORPHONE HCL 1 MG/ML IJ SOLN
0.2500 mg | INTRAMUSCULAR | Status: DC | PRN
  Administered 2015-03-07 (×4): 0.5 mg via INTRAVENOUS

## 2015-03-07 MED ORDER — PROMETHAZINE HCL 25 MG/ML IJ SOLN: 6.2500 mg | INTRAMUSCULAR | Status: DC | PRN

## 2015-03-07 MED ORDER — STERILE WATER FOR INJECTION IJ SOLN
INTRAMUSCULAR | Status: AC
  Filled 2015-03-07: qty 10

## 2015-03-07 MED ORDER — FENTANYL CITRATE (PF) 250 MCG/5ML IJ SOLN
INTRAMUSCULAR | Status: AC
  Filled 2015-03-07: qty 5

## 2015-03-07 MED ORDER — GLYCOPYRROLATE 0.2 MG/ML IJ SOLN
INTRAMUSCULAR | Status: DC | PRN
  Administered 2015-03-07: .7 mg via INTRAVENOUS

## 2015-03-07 MED ORDER — DEXAMETHASONE SODIUM PHOSPHATE 4 MG/ML IJ SOLN
INTRAMUSCULAR | Status: AC
  Filled 2015-03-07: qty 1

## 2015-03-07 MED ORDER — CEFAZOLIN SODIUM-DEXTROSE 2-3 GM-% IV SOLR
INTRAVENOUS | Status: AC
  Filled 2015-03-07: qty 50

## 2015-03-07 MED ORDER — OXYCODONE HCL 5 MG PO TABS
10.0000 mg | ORAL_TABLET | ORAL | Status: DC | PRN
  Administered 2015-03-07 – 2015-03-08 (×7): 20 mg via ORAL
  Administered 2015-03-09: 15 mg via ORAL
  Administered 2015-03-09 (×2): 10 mg via ORAL
  Administered 2015-03-10: 15 mg via ORAL
  Filled 2015-03-07: qty 4
  Filled 2015-03-07: qty 3
  Filled 2015-03-07 (×6): qty 4
  Filled 2015-03-07: qty 3
  Filled 2015-03-07: qty 4
  Filled 2015-03-07: qty 2

## 2015-03-07 MED ORDER — CEFAZOLIN SODIUM-DEXTROSE 2-3 GM-% IV SOLR
INTRAVENOUS | Status: DC | PRN
  Administered 2015-03-07: 2 g via INTRAVENOUS

## 2015-03-07 MED ORDER — 0.9 % SODIUM CHLORIDE (POUR BTL) OPTIME
TOPICAL | Status: DC | PRN
  Administered 2015-03-07: 1000 mL

## 2015-03-07 MED ORDER — HYDROMORPHONE HCL 1 MG/ML IJ SOLN
1.0000 mg | INTRAMUSCULAR | Status: DC | PRN
  Administered 2015-03-08 – 2015-03-09 (×5): 1 mg via INTRAVENOUS
  Filled 2015-03-07 (×5): qty 1

## 2015-03-07 MED ORDER — GLYCOPYRROLATE 0.2 MG/ML IJ SOLN
INTRAMUSCULAR | Status: AC
  Filled 2015-03-07: qty 3

## 2015-03-07 MED ORDER — DOCUSATE SODIUM 100 MG PO CAPS
200.0000 mg | ORAL_CAPSULE | Freq: Two times a day (BID) | ORAL | Status: DC
  Administered 2015-03-07 – 2015-03-10 (×6): 200 mg via ORAL
  Filled 2015-03-07 (×6): qty 2

## 2015-03-07 MED ORDER — MIDAZOLAM HCL 2 MG/2ML IJ SOLN
INTRAMUSCULAR | Status: AC
  Filled 2015-03-07: qty 2

## 2015-03-07 MED ORDER — HYDROMORPHONE HCL 1 MG/ML IJ SOLN
0.5000 mg | INTRAMUSCULAR | Status: DC | PRN
  Administered 2015-03-07: 0.5 mg via INTRAVENOUS
  Filled 2015-03-07: qty 1

## 2015-03-07 MED ORDER — ONDANSETRON HCL 4 MG/2ML IJ SOLN
4.0000 mg | Freq: Four times a day (QID) | INTRAMUSCULAR | Status: DC | PRN
  Administered 2015-03-09: 4 mg via INTRAVENOUS
  Filled 2015-03-07: qty 2

## 2015-03-07 MED ORDER — VECURONIUM BROMIDE 10 MG IV SOLR
INTRAVENOUS | Status: DC | PRN
  Administered 2015-03-07: 2 mg via INTRAVENOUS
  Administered 2015-03-07 (×2): 1 mg via INTRAVENOUS

## 2015-03-07 MED ORDER — ONDANSETRON HCL 4 MG/2ML IJ SOLN
INTRAMUSCULAR | Status: DC | PRN
  Administered 2015-03-07: 4 mg via INTRAVENOUS

## 2015-03-07 MED ORDER — DEXAMETHASONE SODIUM PHOSPHATE 4 MG/ML IJ SOLN
INTRAMUSCULAR | Status: DC | PRN
  Administered 2015-03-07: 4 mg via INTRAVENOUS

## 2015-03-07 MED ORDER — PHENOL 1.4 % MT LIQD: 1.0000 | OROMUCOSAL | Status: DC | PRN

## 2015-03-07 MED ORDER — NEOSTIGMINE METHYLSULFATE 10 MG/10ML IV SOLN
INTRAVENOUS | Status: DC | PRN
  Administered 2015-03-07: 5 mg via INTRAVENOUS

## 2015-03-07 MED ORDER — BUPIVACAINE-EPINEPHRINE 0.25% -1:200000 IJ SOLN
INTRAMUSCULAR | Status: DC | PRN
  Administered 2015-03-07: 20 mL

## 2015-03-07 MED ORDER — MENTHOL 3 MG MT LOZG: 1.0000 | LOZENGE | OROMUCOSAL | Status: DC | PRN

## 2015-03-07 MED ORDER — FENTANYL CITRATE (PF) 100 MCG/2ML IJ SOLN
INTRAMUSCULAR | Status: DC | PRN
  Administered 2015-03-07 (×3): 50 ug via INTRAVENOUS
  Administered 2015-03-07: 100 ug via INTRAVENOUS
  Administered 2015-03-07 (×3): 50 ug via INTRAVENOUS

## 2015-03-07 MED ORDER — ACETAMINOPHEN 325 MG PO TABS: 650.0000 mg | ORAL_TABLET | Freq: Four times a day (QID) | ORAL | Status: DC | PRN

## 2015-03-07 MED ORDER — ENOXAPARIN SODIUM 40 MG/0.4ML ~~LOC~~ SOLN
40.0000 mg | SUBCUTANEOUS | Status: DC
  Administered 2015-03-08 – 2015-03-09 (×2): 40 mg via SUBCUTANEOUS
  Filled 2015-03-07 (×3): qty 0.4

## 2015-03-07 MED ORDER — LIDOCAINE HCL (CARDIAC) 20 MG/ML IV SOLN
INTRAVENOUS | Status: AC
  Filled 2015-03-07: qty 5

## 2015-03-07 MED ORDER — PROPOFOL 10 MG/ML IV BOLUS
INTRAVENOUS | Status: AC
  Filled 2015-03-07: qty 20

## 2015-03-07 MED ORDER — METHOCARBAMOL 500 MG PO TABS
ORAL_TABLET | ORAL | Status: AC
  Administered 2015-03-07: 500 mg via ORAL
  Filled 2015-03-07: qty 1

## 2015-03-07 MED ORDER — NEOSTIGMINE METHYLSULFATE 10 MG/10ML IV SOLN
INTRAVENOUS | Status: AC
  Filled 2015-03-07: qty 1

## 2015-03-07 MED ORDER — OXYCODONE HCL 5 MG/5ML PO SOLN: 5.0000 mg | Freq: Once | ORAL | Status: AC | PRN

## 2015-03-07 MED ORDER — CEFAZOLIN SODIUM-DEXTROSE 2-3 GM-% IV SOLR
INTRAVENOUS | Status: AC
  Filled 2015-03-07: qty 100

## 2015-03-07 MED ORDER — ROCURONIUM BROMIDE 100 MG/10ML IV SOLN
INTRAVENOUS | Status: DC | PRN
  Administered 2015-03-07: 50 mg via INTRAVENOUS

## 2015-03-07 MED ORDER — CHLORHEXIDINE GLUCONATE CLOTH 2 % EX PADS
6.0000 | MEDICATED_PAD | Freq: Every day | CUTANEOUS | Status: DC
  Administered 2015-03-07 – 2015-03-10 (×4): 6 via TOPICAL

## 2015-03-07 MED ORDER — BUPIVACAINE-EPINEPHRINE (PF) 0.25% -1:200000 IJ SOLN
INTRAMUSCULAR | Status: AC
  Filled 2015-03-07: qty 30

## 2015-03-07 MED ORDER — OXYCODONE HCL 5 MG PO TABS
ORAL_TABLET | ORAL | Status: AC
  Filled 2015-03-07: qty 1

## 2015-03-07 MED ORDER — ONDANSETRON HCL 4 MG/2ML IJ SOLN
INTRAMUSCULAR | Status: AC
  Filled 2015-03-07: qty 2

## 2015-03-07 MED ORDER — LIDOCAINE HCL (CARDIAC) 20 MG/ML IV SOLN
INTRAVENOUS | Status: DC | PRN
  Administered 2015-03-07: 80 mg via INTRAVENOUS

## 2015-03-07 MED ORDER — OXYCODONE HCL 5 MG PO TABS
5.0000 mg | ORAL_TABLET | Freq: Once | ORAL | Status: AC | PRN
  Administered 2015-03-07: 5 mg via ORAL

## 2015-03-07 MED ORDER — ONDANSETRON HCL 4 MG PO TABS: 4.0000 mg | ORAL_TABLET | Freq: Four times a day (QID) | ORAL | Status: DC | PRN

## 2015-03-07 MED ORDER — LACTATED RINGERS IV SOLN
INTRAVENOUS | Status: DC
  Administered 2015-03-07 – 2015-03-08 (×4): via INTRAVENOUS

## 2015-03-07 SURGICAL SUPPLY — 67 items
BLADE SURG ROTATE 9660 (MISCELLANEOUS) IMPLANT
COVER SURGICAL LIGHT HANDLE (MISCELLANEOUS) ×3 IMPLANT
DRAPE C-ARM 42X72 X-RAY (DRAPES) IMPLANT
DRAPE IMP U-DRAPE 54X76 (DRAPES) ×3 IMPLANT
DRAPE INCISE IOBAN 66X45 STRL (DRAPES) IMPLANT
DRAPE INCISE IOBAN 85X60 (DRAPES) ×6 IMPLANT
DRAPE ORTHO SPLIT 77X108 STRL (DRAPES) ×4
DRAPE SURG ORHT 6 SPLT 77X108 (DRAPES) ×2 IMPLANT
DRAPE U-SHAPE 47X51 STRL (DRAPES) ×3 IMPLANT
DRSG ADAPTIC 3X8 NADH LF (GAUZE/BANDAGES/DRESSINGS) ×3 IMPLANT
DRSG PAD ABDOMINAL 8X10 ST (GAUZE/BANDAGES/DRESSINGS) ×6 IMPLANT
DURAPREP 26ML APPLICATOR (WOUND CARE) ×3 IMPLANT
ELECT REM PT RETURN 9FT ADLT (ELECTROSURGICAL) ×3
ELECTRODE REM PT RTRN 9FT ADLT (ELECTROSURGICAL) ×1 IMPLANT
EVACUATOR 1/8 PVC DRAIN (DRAIN) IMPLANT
GAUZE SPONGE 4X4 12PLY STRL (GAUZE/BANDAGES/DRESSINGS) ×3 IMPLANT
GAUZE XEROFORM 5X9 LF (GAUZE/BANDAGES/DRESSINGS) ×3 IMPLANT
GLOVE BIOGEL PI IND STRL 8 (GLOVE) ×2 IMPLANT
GLOVE BIOGEL PI INDICATOR 8 (GLOVE) ×4
GLOVE ORTHO TXT STRL SZ7.5 (GLOVE) ×6 IMPLANT
GOWN STRL REUS W/ TWL LRG LVL3 (GOWN DISPOSABLE) ×1 IMPLANT
GOWN STRL REUS W/ TWL XL LVL3 (GOWN DISPOSABLE) ×1 IMPLANT
GOWN STRL REUS W/TWL 2XL LVL3 (GOWN DISPOSABLE) ×3 IMPLANT
GOWN STRL REUS W/TWL LRG LVL3 (GOWN DISPOSABLE) ×2
GOWN STRL REUS W/TWL XL LVL3 (GOWN DISPOSABLE) ×2
KIT BASIN OR (CUSTOM PROCEDURE TRAY) ×3 IMPLANT
KIT ROOM TURNOVER OR (KITS) ×3 IMPLANT
LOOP VESSEL MAXI BLUE (MISCELLANEOUS) IMPLANT
MANIFOLD NEPTUNE II (INSTRUMENTS) ×3 IMPLANT
NEEDLE HYPO 25GX1X1/2 BEV (NEEDLE) ×3 IMPLANT
NEEDLE MAYO TROCAR (NEEDLE) ×3 IMPLANT
NS IRRIG 1000ML POUR BTL (IV SOLUTION) ×3 IMPLANT
PACK TOTAL JOINT (CUSTOM PROCEDURE TRAY) ×3 IMPLANT
PACK UNIVERSAL I (CUSTOM PROCEDURE TRAY) ×3 IMPLANT
PAD ARMBOARD 7.5X6 YLW CONV (MISCELLANEOUS) ×6 IMPLANT
PLATE ACET STRT 142.5M 12H (Plate) ×3 IMPLANT
PLATE ACET STRT 94.5M 8H (Plate) ×3 IMPLANT
PLATE PROXIMAL ACULOC 2 WD RT (Plate) ×3 IMPLANT
SCREW CORT 10X3.5XST NS LF (Screw) ×1 IMPLANT
SCREW CORTEX ST MATTA 3.5X12MM (Screw) ×3 IMPLANT
SCREW CORTEX ST MATTA 3.5X14 (Screw) ×3 IMPLANT
SCREW CORTEX ST MATTA 3.5X16MM (Screw) ×9 IMPLANT
SCREW CORTEX ST MATTA 3.5X18MM (Screw) ×15 IMPLANT
SCREW CORTEX ST MATTA 3.5X20 (Screw) ×9 IMPLANT
SCREW CORTEX ST MATTA 3.5X22MM (Screw) ×3 IMPLANT
SCREW CORTEX ST MATTA 3.5X24 (Screw) ×3 IMPLANT
SCREW CORTEX ST MATTA 3.5X26MM (Screw) ×6 IMPLANT
SCREW CORTEX ST MATTA 3.5X95MM (Screw) ×3 IMPLANT
SCREW CORTICAL 3.5 (Screw) ×2 IMPLANT
SPONGE GAUZE 4X4 12PLY STER LF (GAUZE/BANDAGES/DRESSINGS) ×3 IMPLANT
SPONGE LAP 18X18 X RAY DECT (DISPOSABLE) ×9 IMPLANT
STAPLER VISISTAT 35W (STAPLE) ×3 IMPLANT
SUCTION FRAZIER TIP 10 FR DISP (SUCTIONS) ×3 IMPLANT
SUT ETHIBOND NAB CT1 #1 30IN (SUTURE) ×12 IMPLANT
SUT VIC AB 0 CT1 27 (SUTURE) ×8
SUT VIC AB 0 CT1 27XBRD ANBCTR (SUTURE) ×4 IMPLANT
SUT VIC AB 1 CT1 27 (SUTURE) ×2
SUT VIC AB 1 CT1 27XBRD ANTBC (SUTURE) ×1 IMPLANT
SUT VIC AB 2-0 CT1 27 (SUTURE) ×6
SUT VIC AB 2-0 CT1 TAPERPNT 27 (SUTURE) ×3 IMPLANT
SYR 5ML LL (SYRINGE) IMPLANT
TAPE CLOTH SURG 6X10 WHT LF (GAUZE/BANDAGES/DRESSINGS) ×3 IMPLANT
THR ST PIN 3/32X9 TRO 6PK (Wire) ×3 IMPLANT
TOWEL OR 17X24 6PK STRL BLUE (TOWEL DISPOSABLE) ×3 IMPLANT
TOWEL OR 17X26 10 PK STRL BLUE (TOWEL DISPOSABLE) ×3 IMPLANT
TRAY FOLEY CATH 16FRSI W/METER (SET/KITS/TRAYS/PACK) IMPLANT
WATER STERILE IRR 1000ML POUR (IV SOLUTION) ×12 IMPLANT

## 2015-03-07 NOTE — Anesthesia Procedure Notes (Signed)
Procedure Name: Intubation Date/Time: 03/07/2015 5:07 PM Performed by: Dairl PonderJIANG, Lucindy Borel Pre-anesthesia Checklist: Patient identified, Emergency Drugs available, Suction available, Patient being monitored and Timeout performed Patient Re-evaluated:Patient Re-evaluated prior to inductionOxygen Delivery Method: Circle system utilized Preoxygenation: Pre-oxygenation with 100% oxygen Intubation Type: IV induction Ventilation: Mask ventilation without difficulty Grade View: Grade I Tube type: Oral Tube size: 7.5 mm Number of attempts: 1 Airway Equipment and Method: Stylet Placement Confirmation: ETT inserted through vocal cords under direct vision,  positive ETCO2 and breath sounds checked- equal and bilateral Secured at: 23 cm Tube secured with: Tape Dental Injury: Teeth and Oropharynx as per pre-operative assessment
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
## 2015-03-07 NOTE — Brief Op Note (Signed)
03/02/2015 - 03/07/2015  7:34 PM  PATIENT:  Chris LoosenJonathan T Leblanc  29 y.o. male  PRE-OPERATIVE DIAGNOSIS:  Left Iliac Wing Fracture  POST-OPERATIVE DIAGNOSIS:  Left Iliac Wing Fracture  PROCEDURE:  Procedure(s): Open Reduction Internal Fixation Left Iliac Crest (Left)  SURGEON:  Surgeon(s) and Role:    Eldred Manges* Mark C Yates, MD - Primary  PHYSICIAN ASSISTANT:   ASSISTANTS: April Fulp RNFA   ANESTHESIA:   local and general  EBL:  Total I/O In: 300 [I.V.:300] Out: 150 [Urine:50; Blood:100]  BLOOD ADMINISTERED:none  DRAINS: none   LOCAL MEDICATIONS USED:  MARCAINE     SPECIMEN:  No Specimen  DISPOSITION OF SPECIMEN:  N/A  COUNTS:  YES  TOURNIQUET:  * No tourniquets in log *  DICTATION: .Other Dictation: Dictation Number [id number]  PLAN OF CARE: already inpatient  PATIENT DISPOSITION:  PACU - hemodynamically stable.   Delay start of Pharmacological VTE agent (>24hrs) due to surgical blood loss or risk of bleeding: yes

## 2015-03-07 NOTE — Addendum Note (Signed)
Addendum  created 03/07/15 1122 by Marcene Duosobert Maryfrances Portugal, MD   Modules edited: Anesthesia Blocks and Procedures, Clinical Notes   Clinical Notes:  File: [id number]

## 2015-03-07 NOTE — Transfer of Care (Signed)
Immediate Anesthesia Transfer of Care Note  Patient: [name]  Procedure(s) Performed: Procedure(s): Open Reduction Internal Fixation Left Iliac Crest (Left)  Patient Location: PACU  Anesthesia Type:General  Level of Consciousness: awake, alert , oriented and patient cooperative  Airway & Oxygen Therapy: Patient Spontanous Breathing and Patient connected to nasal cannula oxygen  Post-op Assessment: Report given to RN and Post -op Vital signs reviewed and stable  Post vital signs: Reviewed and stable  Last Vitals:  Filed Vitals:   03/06/15 2220 03/07/15 0601  BP: 150/95 147/80  Pulse:  73  Temp:  37.3 C  Resp:  18    Complications: No apparent anesthesia complications

## 2015-03-07 NOTE — Anesthesia Preprocedure Evaluation (Signed)
Anesthesia Evaluation  Patient identified by MRN, date of birth, ID band Patient awake    Reviewed: Allergy & Precautions, NPO status , Patient's Chart, lab work & pertinent test results  History of Anesthesia Complications (+) PONV  Airway Mallampati: II  TM Distance: >3 FB Neck ROM: Full    Dental no notable dental hx.    Pulmonary neg pulmonary ROS, former smoker,    Pulmonary exam normal breath sounds clear to auscultation       Cardiovascular negative cardio ROS Normal cardiovascular exam Rhythm:Regular Rate:Normal     Neuro/Psych negative neurological ROS  negative psych ROS   GI/Hepatic negative GI ROS, (+)     substance abuse  marijuana use,   Endo/Other  negative endocrine ROS  Renal/GU negative Renal ROS     Musculoskeletal negative musculoskeletal ROS (+)   Abdominal   Peds  Hematology negative hematology ROS (+)   Anesthesia Other Findings   Reproductive/Obstetrics                             Anesthesia Physical  Anesthesia Plan  ASA: II  Anesthesia Plan: General   Post-op Pain Management: MAC Combined w/ Regional for Post-op pain   Induction: Intravenous  Airway Management Planned: Oral ETT  Additional Equipment:   Intra-op Plan:   Post-operative Plan: Extubation in OR  Informed Consent: I have reviewed the patients History and Physical, chart, labs and discussed the procedure including the risks, benefits and alternatives for the proposed anesthesia with the patient or authorized representative who has indicated his/her understanding and acceptance.   Dental advisory given  Plan Discussed with: CRNA, Anesthesiologist and Surgeon  Anesthesia Plan Comments:         Anesthesia Quick Evaluation

## 2015-03-08 ENCOUNTER — Inpatient Hospital Stay (HOSPITAL_COMMUNITY): Payer: No Typology Code available for payment source

## 2015-03-08 ENCOUNTER — Encounter (HOSPITAL_COMMUNITY): Payer: Self-pay | Admitting: Orthopaedic Surgery

## 2015-03-08 DIAGNOSIS — G8918 Other acute postprocedural pain: Secondary | ICD-10-CM

## 2015-03-08 DIAGNOSIS — D72829 Elevated white blood cell count, unspecified: Secondary | ICD-10-CM

## 2015-03-08 DIAGNOSIS — S42302S Unspecified fracture of shaft of humerus, left arm, sequela: Secondary | ICD-10-CM

## 2015-03-08 DIAGNOSIS — S32302S Unspecified fracture of left ilium, sequela: Secondary | ICD-10-CM

## 2015-03-08 DIAGNOSIS — D62 Acute posthemorrhagic anemia: Secondary | ICD-10-CM

## 2015-03-08 DIAGNOSIS — R339 Retention of urine, unspecified: Secondary | ICD-10-CM

## 2015-03-08 LAB — BASIC METABOLIC PANEL
Anion gap: 7 (ref 5–15)
BUN: 11 mg/dL (ref 6–20)
CHLORIDE: 101 mmol/L (ref 101–111)
CO2: 26 mmol/L (ref 22–32)
Calcium: 8.7 mg/dL — ABNORMAL LOW (ref 8.9–10.3)
Creatinine, Ser: 0.92 mg/dL (ref 0.61–1.24)
GFR calc Af Amer: >60 mL/min (ref >60)
GLUCOSE: 120 mg/dL — AB (ref 65–99)
POTASSIUM: 4.1 mmol/L (ref 3.5–5.1)
Sodium: 134 mmol/L — ABNORMAL LOW (ref 135–145)

## 2015-03-08 LAB — CBC
HCT: 37 % — ABNORMAL LOW (ref 39.0–52.0)
Hemoglobin: 12.7 g/dL — ABNORMAL LOW (ref 13.0–17.0)
MCH: 32.2 pg (ref 26.0–34.0)
MCHC: 34.3 g/dL (ref 30.0–36.0)
MCV: 93.7 fL (ref 78.0–100.0)
PLATELETS: 273 10*3/uL (ref 150–400)
RBC: 3.95 MIL/uL — AB (ref 4.22–5.81)
RDW: 12.2 % (ref 11.5–15.5)
WBC: 11.3 10*3/uL — ABNORMAL HIGH (ref 4.0–10.5)

## 2015-03-08 MED ORDER — TRAMADOL HCL 50 MG PO TABS
100.0000 mg | ORAL_TABLET | Freq: Four times a day (QID) | ORAL | Status: DC
  Administered 2015-03-08 – 2015-03-10 (×9): 100 mg via ORAL
  Filled 2015-03-08 (×9): qty 2

## 2015-03-08 MED ORDER — NAPROXEN 250 MG PO TABS
500.0000 mg | ORAL_TABLET | Freq: Two times a day (BID) | ORAL | Status: DC
  Administered 2015-03-08 – 2015-03-10 (×5): 500 mg via ORAL
  Filled 2015-03-08 (×5): qty 2
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
## 2015-03-08 NOTE — Progress Notes (Signed)
Rehab admissions - Please see consult done by Dr. Allena KatzPatel today.  I will follow up with patient in am.  Call me for questions.  #[phone]
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
## 2015-03-08 NOTE — Progress Notes (Signed)
PT Cancellation Note  Patient Details Name: Chris LoosenJonathan T Leblanc MRN: [id number] DOB: 1985-04-15   Cancelled Treatment:    Reason Eval/Treat Not Completed: Other (comment) (Refused due to 10/10 pain but medicated, with nursing in roo)m.  Pt will be attempted again tomorrow.   [name], [name] 03/08/2015, 2:40 PM   [name], PT MS Acute Rehab Dept. Number: [facility] [phone] and [facility] [phone]

## 2015-03-08 NOTE — Progress Notes (Signed)
Subjective: 1 Day Post-Op Procedure(s) (LRB): Open Reduction Internal Fixation Left Iliac Crest (Left) Patient reports pain as 6 on 0-10 scale and 7 on 0-10 scale.    Objective: Vital signs in last 24 hours: Temp:  [97.3 F (36.3 C)-99.3 F (37.4 C)] 99.3 F (37.4 C) (12/13 0420) Pulse Rate:  [66-107] 91 (12/13 0420) Resp:  [13-22] 17 (12/13 0420) BP: (132-155)/(66-86) 137/79 mmHg (12/13 0420) SpO2:  [95 %-99 %] 96 % (12/13 0420)  Intake/Output from previous day: 12/12 0701 - 12/13 0700 In: 3733.2 [P.O.:1344; I.V.:2389.2] Out: 4450 [Urine:4300; Blood:150] Intake/Output this shift:     Recent Labs  03/07/15 0602 03/07/15 1555 03/08/15 0520  HGB 12.2* 12.6* 12.7*    Recent Labs  03/07/15 0602 03/07/15 1555 03/08/15 0520  WBC 9.4  --  11.3*  RBC 3.78*  --  3.95*  HCT 35.4* 37.0* 37.0*  PLT 224  --  273    Recent Labs  03/07/15 1555 03/08/15 0520  NA 136 134*  K 3.8 4.1  CL  --  101  CO2  --  26  BUN  --  11  CREATININE  --  0.92  GLUCOSE 98 120*  CALCIUM  --  8.7*   No results for input(s): LABPT, INR in the last 72 hours.  Neurologically intact . Lateral femoral cutaneous nerve is intact sensation. Dressing dry .   Assessment/Plan: 1 Day Post-Op Procedure(s) (LRB): Open Reduction Internal Fixation Left Iliac Crest (Left) Up with therapy expect home with his Dad in a couple days.   Audiel Scheiber C 03/08/2015, 8:17 AM
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
## 2015-03-09 ENCOUNTER — Encounter (HOSPITAL_COMMUNITY): Payer: Self-pay | Admitting: Orthopaedic Surgery

## 2015-03-09 MED ORDER — BISACODYL 10 MG RE SUPP: 10.0000 mg | Freq: Every day | RECTAL | Status: DC | PRN

## 2015-03-09 MED ORDER — POLYETHYLENE GLYCOL 3350 17 G PO PACK
17.0000 g | PACK | Freq: Two times a day (BID) | ORAL | Status: DC
  Administered 2015-03-09 – 2015-03-10 (×2): 17 g via ORAL
  Filled 2015-03-09 (×2): qty 1

## 2015-03-09 NOTE — Progress Notes (Signed)
Patient had not voided since the removal of the foley cath at 03/08/15; 1023 but patient stated, "I had not been drinking a lot today since I was sleeping during the day since I did not sleep well last night.  I do not feel my bladder is full and I will know if my bladder is full" .  Will monitor closely.

## 2015-03-09 NOTE — Progress Notes (Signed)
Rehab admissions - I met with patient, mom and dad this am.  Patient will consider inpatient rehab admission and let me know in the morning.  He says he is exhausted and needs to sleep today.  He is interested in inpatient rehab admission.  I gave him brochures and a let explaining costs of rehab stay.  I will have my partner follow up in am.  Call me for questions.  #[phone]
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
## 2015-03-09 NOTE — Progress Notes (Signed)
Convinced patient to have bladder scan.  Paged on-call Trauma MD Kinsinger to have an order for In and Out Cath due to bladder scan volume of 961 mL. In and Out Cath ordered and outputted 850 mL.  Patient tolerated well the procedure.

## 2015-03-09 NOTE — Progress Notes (Addendum)
Pt has not voided on his own, bladder scanned for 485cc at 1442, had pt sit at edge of bed and try standing to void, no success. Pt up in chair currently.  Contacted Megan Baird,PA at this time with above info, pt does not feel uncomfortable at this time, plan per PA : wait few hours and bladder scan pt again, if pt has >600-700cc in bladder call the on call MD and get orders (I & O cath or replace foley)

## 2015-03-09 NOTE — Anesthesia Postprocedure Evaluation (Signed)
Anesthesia Post Note  Patient: Cyril LoosenJonathan T Kneisley  Procedure(s) Performed: Procedure(s) (LRB): Open Reduction Internal Fixation Left Iliac Crest (Left)  Patient location during evaluation: PACU Anesthesia Type: General Level of consciousness: awake and alert Pain management: pain level controlled Vital Signs Assessment: post-procedure vital signs reviewed and stable Respiratory status: spontaneous breathing, nonlabored ventilation, respiratory function stable and patient connected to nasal cannula oxygen Cardiovascular status: blood pressure returned to baseline and stable Postop Assessment: no signs of nausea or vomiting Anesthetic complications: no    Last Vitals:  Filed Vitals:   03/08/15 2142 03/09/15 0431  BP: 129/79 147/77  Pulse: 76 71  Temp: 36.6 C 36.7 C  Resp: 18 18    Last Pain:  Filed Vitals:   03/09/15 1117  PainSc: 4                  Lashika Erker,JAMES TERRILL

## 2015-03-09 NOTE — Progress Notes (Signed)
Attempted to order Mepilex dressing for patients leg but materials management stated that they don't carry that dressing anymore. Paged MD but was referred to on-call pager in which nursing secretary told me to call back tomorrow because it wasn't considered an emergency for patient; and that she would send message to office. Foam dressing placed as substitute until further notice.
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
## 2015-03-09 NOTE — Progress Notes (Signed)
Subjective: Doing well.  Pain controlled.     Objective: Vital signs in last 24 hours: Temp:  [97.8 F (36.6 C)-98.1 F (36.7 C)] 98.1 F (36.7 C) (12/14 0431) Pulse Rate:  [71-76] 71 (12/14 0431) Resp:  [18] 18 (12/14 0431) BP: (129-147)/(77-79) 147/77 mmHg (12/14 0431) SpO2:  [96 %-98 %] 98 % (12/14 0431)  Intake/Output from previous day: 12/13 0701 - 12/14 0700 In: 1944.2 [P.O.:960; I.V.:984.2] Out: 850 [Urine:850] Intake/Output this shift:     Recent Labs  03/07/15 0602 03/07/15 1555 03/08/15 0520  HGB 12.2* 12.6* 12.7*    Recent Labs  03/07/15 0602 03/07/15 1555 03/08/15 0520  WBC 9.4  --  11.3*  RBC 3.78*  --  3.95*  HCT 35.4* 37.0* 37.0*  PLT 224  --  273    Recent Labs  03/07/15 1555 03/08/15 0520  NA 136 134*  K 3.8 4.1  CL  --  101  CO2  --  26  BUN  --  11  CREATININE  --  0.92  GLUCOSE 98 120*  CALCIUM  --  8.7*   No results for input(s): LABPT, INR in the last 72 hours.  Exam:  Alert and oriented.  aquacel dressing left UE intact.  Right pelvis wound looks good.  Staples intact.  Some serosang drainage on dressing.  No signs of infections.  NVI.  No motor deficits.    Assessment/Plan: From ortho standpoint doing well.  D/c home when stable cleared by trauma service.    Eliyanah Elgersma M 03/09/2015, 3:11 PM

## 2015-03-10 DIAGNOSIS — S32009A Unspecified fracture of unspecified lumbar vertebra, initial encounter for closed fracture: Secondary | ICD-10-CM | POA: Diagnosis present

## 2015-03-10 MED ORDER — POLYETHYLENE GLYCOL 3350 17 G PO PACK: 17.0000 g | PACK | Freq: Two times a day (BID) | ORAL | Status: DC | PRN

## 2015-03-10 MED ORDER — MAGNESIUM CITRATE PO SOLN
1.0000 | Freq: Once | ORAL | Status: AC
  Administered 2015-03-10: 1 via ORAL
  Filled 2015-03-10: qty 296

## 2015-03-10 MED ORDER — ASPIRIN 325 MG PO TABS: 325.0000 mg | ORAL_TABLET | Freq: Every day | ORAL | Status: AC

## 2015-03-10 MED ORDER — BISACODYL 10 MG RE SUPP: 10.0000 mg | Freq: Every day | RECTAL | Status: DC | PRN

## 2015-03-10 MED ORDER — TRAMADOL HCL 50 MG PO TABS: 100.0000 mg | ORAL_TABLET | Freq: Four times a day (QID) | ORAL | Status: DC | PRN

## 2015-03-10 MED ORDER — METHOCARBAMOL 750 MG PO TABS: 750.0000 mg | ORAL_TABLET | Freq: Four times a day (QID) | ORAL | Status: DC | PRN

## 2015-03-10 MED ORDER — NAPROXEN 500 MG PO TABS: 500.0000 mg | ORAL_TABLET | Freq: Two times a day (BID) | ORAL | Status: DC

## 2015-03-10 MED ORDER — TAMSULOSIN HCL 0.4 MG PO CAPS
0.4000 mg | ORAL_CAPSULE | Freq: Every day | ORAL | Status: DC
Start: 2015-03-10 — End: 2020-12-01

## 2015-03-10 MED ORDER — OXYCODONE HCL 10 MG PO TABS: 5.0000 mg | ORAL_TABLET | Freq: Four times a day (QID) | ORAL | Status: DC | PRN

## 2015-03-10 MED ORDER — DOCUSATE SODIUM 100 MG PO CAPS: 200.0000 mg | ORAL_CAPSULE | Freq: Two times a day (BID) | ORAL | Status: DC | PRN

## 2015-03-10 MED ORDER — ACETAMINOPHEN 325 MG PO TABS: 650.0000 mg | ORAL_TABLET | Freq: Four times a day (QID) | ORAL | Status: DC | PRN

## 2015-03-10 MED ORDER — BETHANECHOL CHLORIDE 25 MG PO TABS: 25.0000 mg | ORAL_TABLET | Freq: Four times a day (QID) | ORAL | Status: DC

## 2015-03-10 NOTE — Progress Notes (Signed)
Pt. Is showing good progress with PT and does not feel he needs the intensity of CIR.  Sidney AceJulie Amerson, RNCM is aware.  I will sign off.    Weldon PickingSusan Analisia Kingsford PT Inpatient Rehab Admissions Coordinator Cell [phone] Office [phone]

## 2015-03-10 NOTE — Discharge Instructions (Signed)
Dr. Ophelia CharterYates wants you to take one aspirin 325mg  tablet a day for 4 weeks.  Call to confirm appointment date/time with Dr. Ophelia CharterYates in 2-3 weeks

## 2015-03-10 NOTE — Care Management Note (Addendum)
Case Management Note  Patient Details  Name: [name] MRN: [id number] Date of Birth: 05-Nov-1985  Subjective/Objective:   Pt has declined admission to inpatient rehab; prefers to go home with parents.  Will need home health follow up and DME.                   [facility].  [facility]'s father, Chris Fortsom Leblanc, [phone].  [facility] date.  DME delivered to pt's room prior to dc.    Expected Discharge Date:   03/10/2015               Expected Discharge Plan:  Home w Home Health Services  In-House Referral:  Clinical Social Work  Discharge planning Services  CM Consult  Post Acute Care Choice:  Home Health Choice offered to:     DME Arranged:  3-N-1, Cane DME Agency:  [facility]  HH Arranged:  PT, OT [facility] Agency:  [facility]  Status of Service:  Completed, signed off  Medicare Important Message Given:    Date Medicare IM Given:    Medicare IM give by:    Date Additional Medicare IM Given:    Additional Medicare Important Message give by:     If discussed at Long Length of Stay Meetings, dates discussed:    Additional Comments:  Pt is uninsured and needs help paying for meds.  He is eligible for medication assistance through [facility].  [facility] given with explanation of program benefits.    Quintella BatonJulie W. Sophya Vanblarcom, RN, BSN  Trauma/Neuro ICU Case Manager [phone]
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
## 2015-03-10 NOTE — Progress Notes (Signed)
Chris GalaJonathan Leblanc to be D/C'd  per MD order. Discussed with the patient and all questions fully answered.  VSS, Skin clean, dry and intact without evidence of skin break down, no evidence of skin tears noted.  IV catheter discontinued intact. Site without signs and symptoms of complications. Dressing and pressure applied.  An After Visit Summary was printed and given to the patient. Patient received prescription.  D/c education completed with patient/family including follow up instructions, medication list, d/c activities limitations if indicated, with other d/c instructions as indicated by MD - patient able to verbalize understanding, all questions fully answered.   Patient instructed to return to ED, call 911, or call MD for any changes in condition.   Patient to be escorted via WC, and D/C home via private auto.
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
## 2015-03-10 NOTE — Progress Notes (Signed)
Reviewed how to  Use the leg bag with patient
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
## 2017-09-12 ENCOUNTER — Ambulatory Visit: Payer: Self-pay | Admitting: Nurse Practitioner

## 2017-09-12 VITALS — BP 120/78 | HR 65 | Temp 98.2°F | Resp 18 | Wt 205.8 lb

## 2017-09-12 DIAGNOSIS — J069 Acute upper respiratory infection, unspecified: Secondary | ICD-10-CM

## 2017-09-12 DIAGNOSIS — J029 Acute pharyngitis, unspecified: Secondary | ICD-10-CM

## 2017-09-12 LAB — POCT RAPID STREP A (OFFICE): Rapid Strep A Screen: NEGATIVE

## 2017-09-12 MED ORDER — FLUTICASONE PROPIONATE 50 MCG/ACT NA SUSP: 2.0000 | Freq: Every day | NASAL | 0 refills | Status: AC

## 2017-09-12 NOTE — Progress Notes (Signed)
Subjective:     [name] is a 32 y.o. male who presents for evaluation of sore throat. Associated symptoms include no fever, nasal blockage, pain while swallowing, sinus and nasal congestion, sore throat and swollen glands.  The patient states he also has body aches, and coughed up some yellow sputum this morning.  Onset of symptoms was 4 days ago, and have been unchanged since that time. He is drinking plenty of fluids. He has not had a recent close exposure to someone with proven streptococcal pharyngitis.  The following portions of the patient's history were reviewed and updated as appropriate: allergies, current medications and past medical history.  Review of Systems Constitutional: positive for anorexia, fatigue and malaise, negative for chills, night sweats and sweats Eyes: negative Ears, nose, mouth, throat, and face: positive for nasal congestion and sore throat, negative for ear drainage, earaches and hoarseness Respiratory: positive for cough and sputum Cardiovascular: negative Gastrointestinal: positive for decreased appetite, negative for abdominal pain, diarrhea, nausea and vomiting Neurological: negative Allergic/Immunologic: negative    Objective:    BP 120/78 (BP Location: Right Arm, Patient Position: Sitting, Cuff Size: Normal)   Pulse 65   Temp 98.2 F (36.8 C) (Oral)   Resp 18   Wt 205 lb 12.8 oz (93.4 kg)   SpO2 95%   BMI 29.53 kg/m  General appearance: alert, cooperative, fatigued and no distress Head: Normocephalic, without obvious abnormality, atraumatic Eyes: conjunctivae/corneas clear. PERRL, EOM's intact. Fundi benign. Ears: normal TM's and external ear canals both ears Nose: clear discharge, moderate congestion, turbinates swollen, inflamed, no sinus tenderness Throat: abnormal findings: moderate oropharyngeal erythema, +2, no exudates Lungs: clear to auscultation bilaterally Heart: regular rate and rhythm, S1, S2 normal, no murmur, click, rub or  gallop Abdomen: soft, non-tender; bowel sounds normal; no masses,  no organomegaly Pulses: 2+ and symmetric Skin: Skin color, texture, turgor normal. No rashes or lesions Lymph nodes: cervical adenopathy, bilateral Neurologic: Grossly normal  Laboratory Strep test done. Results:negative.    Assessment:   Viral Upper Respiratory Infection  Plan:    Discussed with patient at length about viral illnesses.  Patient informed at this time his symptoms are related to an acute viral upper respiratory infection.  Patient informed that symptomatic will be best at this time.  Patient instructed to use ibuprofen 800 mg every 8 hours for the next 3 days.  Patient also given fluticasone steroid nasal spray 2 sprays in each nostril daily for 10 days.  Patient was offered Magic mouthwash for his throat discomfort/pain, but patient refused at this time.  She was instructed to use warm salt water gargles, honey, honey with lemon, warm teas or liquids, and cool liquids.  Patient instructed to continue hydrating as he does not want to become dehydrated.  Patient was provided work note to return to work on Saturday, June  22nd.  Patient was instructed to follow-up in the next 5 to 7 days if his symptoms do not improve.  She was in agreement with this course of treatment.  Patient verbalizes understanding and has no questions at time of discharge. Meds ordered this encounter  Medications  . fluticasone (FLONASE) 50 MCG/ACT nasal spray    Sig: Place 2 sprays into both nostrils daily for 10 days.    Dispense:  16 g    Refill:  0    Order Specific Question:   Supervising Provider    Answer:   Stacie GlazeJENKINS, JOHN E [phone]]

## 2017-09-12 NOTE — Patient Instructions (Signed)

## 2017-09-14 ENCOUNTER — Other Ambulatory Visit: Payer: Self-pay | Admitting: Nurse Practitioner

## 2017-09-14 ENCOUNTER — Telehealth: Payer: Self-pay | Admitting: Emergency Medicine

## 2017-09-14 MED ORDER — AZITHROMYCIN 250 MG PO TABS: ORAL_TABLET | ORAL | 0 refills | Status: AC

## 2017-09-14 NOTE — Progress Notes (Signed)
Patient called stating he was not feeling any better.  Informed patient I will send in a z-pack to his preferred pharmacy.

## 2017-09-14 NOTE — Telephone Encounter (Signed)
Patient called in to notify the provider christy that he is not feeling any better, after speaking with the provder she stated that she would sent the patient in a zpac to the patients pharmacy. Patient also asked about another work note, I notified him that our providers only write an excuse for three days and anything after that he will need to follow up with his provider. Patient stated he understood

## 2019-01-29 ENCOUNTER — Other Ambulatory Visit: Payer: Self-pay

## 2019-01-29 DIAGNOSIS — Z20822 Contact with and (suspected) exposure to covid-19: Secondary | ICD-10-CM

## 2019-01-30 LAB — NOVEL CORONAVIRUS, NAA: SARS-CoV-2, NAA: NOT DETECTED

## 2019-11-16 ENCOUNTER — Encounter (HOSPITAL_COMMUNITY): Payer: Self-pay

## 2019-11-16 ENCOUNTER — Other Ambulatory Visit: Payer: Self-pay

## 2019-11-16 ENCOUNTER — Emergency Department (HOSPITAL_COMMUNITY)
Admission: EM | Admit: 2019-11-16 | Discharge: 2019-11-16 | Disposition: A | Discharge status: Home / Self Care | Payer: Self-pay | Attending: Emergency Medicine | Admitting: Emergency Medicine

## 2019-11-16 DIAGNOSIS — Y999 Unspecified external cause status: Secondary | ICD-10-CM | POA: Insufficient documentation

## 2019-11-16 DIAGNOSIS — Z5321 Procedure and treatment not carried out due to patient leaving prior to being seen by health care provider: Secondary | ICD-10-CM | POA: Insufficient documentation

## 2019-11-16 DIAGNOSIS — T2009XA Burn of unspecified degree of multiple sites of head, face, and neck, initial encounter: Secondary | ICD-10-CM | POA: Insufficient documentation

## 2019-11-16 DIAGNOSIS — Y93G9 Activity, other involving cooking and grilling: Secondary | ICD-10-CM | POA: Insufficient documentation

## 2019-11-16 DIAGNOSIS — X58XXXA Exposure to other specified factors, initial encounter: Secondary | ICD-10-CM | POA: Insufficient documentation

## 2019-11-16 DIAGNOSIS — Y929 Unspecified place or not applicable: Secondary | ICD-10-CM | POA: Insufficient documentation

## 2019-11-16 NOTE — ED Notes (Signed)
Called for vitals and no response 

## 2019-11-16 NOTE — ED Notes (Signed)
Pt called to update vitals and no response
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
## 2020-11-30 ENCOUNTER — Encounter (HOSPITAL_COMMUNITY): Payer: Self-pay

## 2020-11-30 ENCOUNTER — Emergency Department (HOSPITAL_COMMUNITY): Payer: Self-pay

## 2020-11-30 ENCOUNTER — Other Ambulatory Visit: Payer: Self-pay

## 2020-11-30 ENCOUNTER — Emergency Department (HOSPITAL_COMMUNITY)
Admission: EM | Admit: 2020-11-30 | Discharge: 2020-12-01 | Disposition: A | Discharge status: Home / Self Care | Payer: Self-pay | Attending: Emergency Medicine | Admitting: Emergency Medicine

## 2020-11-30 DIAGNOSIS — F10920 Alcohol use, unspecified with intoxication, uncomplicated: Secondary | ICD-10-CM

## 2020-11-30 DIAGNOSIS — Z23 Encounter for immunization: Secondary | ICD-10-CM | POA: Insufficient documentation

## 2020-11-30 DIAGNOSIS — Z87891 Personal history of nicotine dependence: Secondary | ICD-10-CM | POA: Insufficient documentation

## 2020-11-30 DIAGNOSIS — S80212A Abrasion, left knee, initial encounter: Secondary | ICD-10-CM | POA: Insufficient documentation

## 2020-11-30 DIAGNOSIS — R4182 Altered mental status, unspecified: Secondary | ICD-10-CM | POA: Insufficient documentation

## 2020-11-30 DIAGNOSIS — S299XXA Unspecified injury of thorax, initial encounter: Secondary | ICD-10-CM | POA: Insufficient documentation

## 2020-11-30 DIAGNOSIS — Z20822 Contact with and (suspected) exposure to covid-19: Secondary | ICD-10-CM | POA: Insufficient documentation

## 2020-11-30 DIAGNOSIS — R791 Abnormal coagulation profile: Secondary | ICD-10-CM | POA: Insufficient documentation

## 2020-11-30 DIAGNOSIS — Z79899 Other long term (current) drug therapy: Secondary | ICD-10-CM | POA: Insufficient documentation

## 2020-11-30 DIAGNOSIS — S80211A Abrasion, right knee, initial encounter: Secondary | ICD-10-CM | POA: Insufficient documentation

## 2020-11-30 DIAGNOSIS — S3991XA Unspecified injury of abdomen, initial encounter: Secondary | ICD-10-CM | POA: Insufficient documentation

## 2020-11-30 DIAGNOSIS — Y92410 Unspecified street and highway as the place of occurrence of the external cause: Secondary | ICD-10-CM | POA: Insufficient documentation

## 2020-11-30 DIAGNOSIS — S40212A Abrasion of left shoulder, initial encounter: Secondary | ICD-10-CM | POA: Insufficient documentation

## 2020-11-30 DIAGNOSIS — E876 Hypokalemia: Secondary | ICD-10-CM

## 2020-11-30 DIAGNOSIS — Y907 Blood alcohol level of 200-239 mg/100 ml: Secondary | ICD-10-CM | POA: Insufficient documentation

## 2020-11-30 LAB — COMPREHENSIVE METABOLIC PANEL
ALT: 24 U/L (ref 0–44)
AST: 26 U/L (ref 15–41)
Albumin: 4.5 g/dL (ref 3.5–5.0)
Alkaline Phosphatase: 47 U/L (ref 38–126)
Anion gap: 9 (ref 5–15)
BUN: 11 mg/dL (ref 6–20)
CO2: 22 mmol/L (ref 22–32)
Calcium: 9 mg/dL (ref 8.9–10.3)
Chloride: 110 mmol/L (ref 98–111)
Creatinine, Ser: 1.08 mg/dL (ref 0.61–1.24)
GFR, Estimated: >60 mL/min (ref >60)
Glucose, Bld: 81 mg/dL (ref 70–99)
Potassium: 3.3 mmol/L — ABNORMAL LOW (ref 3.5–5.1)
Sodium: 141 mmol/L (ref 135–145)
Total Bilirubin: 0.7 mg/dL (ref 0.3–1.2)
Total Protein: 7.7 g/dL (ref 6.5–8.1)

## 2020-11-30 LAB — URINALYSIS, ROUTINE W REFLEX MICROSCOPIC
Bilirubin Urine: NEGATIVE
Glucose, UA: NEGATIVE mg/dL
Hgb urine dipstick: NEGATIVE
Ketones, ur: NEGATIVE mg/dL
Leukocytes,Ua: NEGATIVE
Nitrite: NEGATIVE
Protein, ur: NEGATIVE mg/dL
Specific Gravity, Urine: 1.001 — ABNORMAL LOW (ref 1.005–1.030)
pH: 6 (ref 5.0–8.0)

## 2020-11-30 LAB — SAMPLE TO BLOOD BANK

## 2020-11-30 LAB — RESP PANEL BY RT-PCR (FLU A&B, COVID) ARPGX2
Influenza A by PCR: NEGATIVE
Influenza B by PCR: NEGATIVE
SARS Coronavirus 2 by RT PCR: NEGATIVE

## 2020-11-30 LAB — CBC
HCT: 45.4 % (ref 39.0–52.0)
Hemoglobin: 15.8 g/dL (ref 13.0–17.0)
MCH: 32.5 pg (ref 26.0–34.0)
MCHC: 34.8 g/dL (ref 30.0–36.0)
MCV: 93.4 fL (ref 80.0–100.0)
Platelets: 262 10*3/uL (ref 150–400)
RBC: 4.86 MIL/uL (ref 4.22–5.81)
RDW: 12.1 % (ref 11.5–15.5)
WBC: 10.6 10*3/uL — ABNORMAL HIGH (ref 4.0–10.5)
nRBC: 0 % (ref 0.0–0.2)

## 2020-11-30 LAB — RAPID URINE DRUG SCREEN, HOSP PERFORMED
Amphetamines: NOT DETECTED
Barbiturates: NOT DETECTED
Benzodiazepines: NOT DETECTED
Cocaine: NOT DETECTED
Opiates: NOT DETECTED
Tetrahydrocannabinol: POSITIVE — AB

## 2020-11-30 LAB — LACTIC ACID, PLASMA: Lactic Acid, Venous: 2.3 mmol/L (ref 0.5–1.9)

## 2020-11-30 LAB — PROTIME-INR
INR: 0.9 (ref 0.8–1.2)
Prothrombin Time: 12.3 seconds (ref 11.4–15.2)

## 2020-11-30 LAB — ETHANOL: Alcohol, Ethyl (B): 208 mg/dL — ABNORMAL HIGH (ref <10)

## 2020-11-30 MED ORDER — NICOTINE POLACRILEX 2 MG MT GUM
2.0000 mg | CHEWING_GUM | OROMUCOSAL | Status: DC | PRN
  Administered 2020-11-30: 2 mg via ORAL
  Filled 2020-11-30: qty 1

## 2020-11-30 MED ORDER — POTASSIUM CHLORIDE CRYS ER 20 MEQ PO TBCR
20.0000 meq | EXTENDED_RELEASE_TABLET | Freq: Once | ORAL | Status: AC
  Administered 2020-12-01: 20 meq via ORAL
  Filled 2020-11-30: qty 1

## 2020-11-30 MED ORDER — IOHEXOL 350 MG/ML SOLN
80.0000 mL | Freq: Once | INTRAVENOUS | Status: AC | PRN
  Administered 2020-11-30: 80 mL via INTRAVENOUS

## 2020-11-30 MED ORDER — DROPERIDOL 2.5 MG/ML IJ SOLN
5.0000 mg | Freq: Once | INTRAMUSCULAR | Status: AC
  Administered 2020-11-30: 5 mg via INTRAVENOUS
  Filled 2020-11-30: qty 2

## 2020-11-30 MED ORDER — TETANUS-DIPHTH-ACELL PERTUSSIS 5-2.5-18.5 LF-MCG/0.5 IM SUSY
0.5000 mL | PREFILLED_SYRINGE | Freq: Once | INTRAMUSCULAR | Status: AC
  Administered 2020-11-30: 0.5 mL via INTRAMUSCULAR
  Filled 2020-11-30: qty 0.5

## 2020-11-30 MED ORDER — FENTANYL CITRATE PF 50 MCG/ML IJ SOSY: 50.0000 ug | PREFILLED_SYRINGE | Freq: Once | INTRAMUSCULAR | Status: DC

## 2020-11-30 MED ORDER — LACTATED RINGERS IV BOLUS
1000.0000 mL | Freq: Once | INTRAVENOUS | Status: AC
  Administered 2020-11-30: 1000 mL via INTRAVENOUS
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
## 2020-11-30 NOTE — ED Notes (Signed)
EKG obtained, awaiting order for export. 

## 2020-11-30 NOTE — ED Notes (Signed)
Patient currently in CT °

## 2020-11-30 NOTE — ED Notes (Signed)
Portable xray at bedside.
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
## 2020-12-01 MED ORDER — KETOROLAC TROMETHAMINE 30 MG/ML IJ SOLN: 30.0000 mg | Freq: Once | INTRAMUSCULAR | Status: DC

## 2020-12-01 MED ORDER — POTASSIUM CHLORIDE CRYS ER 20 MEQ PO TBCR: 20.0000 meq | EXTENDED_RELEASE_TABLET | Freq: Two times a day (BID) | ORAL | 0 refills | Status: AC

## 2020-12-01 NOTE — ED Notes (Signed)
Patient awake and able to ambulate to bathroom without assistance.  Steady gait noted.  Calling a friend for ride home

## 2020-12-01 NOTE — Discharge Instructions (Addendum)
Recommend follow-up outpatient with orthopedics regarding your L humerus hardware CT read as it reveals some chronic appearing loosening of your surgical hardware: 1. Prior midshaft humeral fracture with fracture nonunion. 2. 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 The plate is not opposed to the cortex in the proximal and distal aspect. Many of the screws are displaced as described.  Apply ice to any area that is painful.  Ice can be applied for 30 minutes at a time, 4 times a day.  You may take ibuprofen and/or acetaminophen as needed for pain.  Please note that combining ibuprofen and acetaminophen gives you better pain relief than either medication by itself.
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
